# Patient Record
Sex: Male | Born: 1960 | Race: White | Hispanic: No | Smoking: Former smoker
Health system: Southern US, Community
[De-identification: ages and names within clinical notes are randomized; demographics above are authoritative.]

## PROBLEM LIST (undated history)

## (undated) DIAGNOSIS — J449 Chronic obstructive pulmonary disease, unspecified: Secondary | ICD-10-CM

## (undated) DIAGNOSIS — F319 Bipolar disorder, unspecified: Secondary | ICD-10-CM

## (undated) DIAGNOSIS — F329 Major depressive disorder, single episode, unspecified: Secondary | ICD-10-CM

## (undated) DIAGNOSIS — F1910 Other psychoactive substance abuse, uncomplicated: Secondary | ICD-10-CM

## (undated) DIAGNOSIS — E119 Type 2 diabetes mellitus without complications: Secondary | ICD-10-CM

## (undated) DIAGNOSIS — F32A Depression, unspecified: Secondary | ICD-10-CM

## (undated) HISTORY — PX: OTHER SURGICAL HISTORY: SHX169

## (undated) HISTORY — DX: Type 2 diabetes mellitus without complications: E11.9

## (undated) HISTORY — DX: Bipolar disorder, unspecified: F31.9

## (undated) HISTORY — DX: Depression, unspecified: F32.A

## (undated) HISTORY — DX: Chronic obstructive pulmonary disease, unspecified: J44.9

## (undated) HISTORY — DX: Other psychoactive substance abuse, uncomplicated: F19.10

---
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
## 2004-12-12 ENCOUNTER — Encounter: Admission: RE | Admit: 2004-12-12 | Discharge: 2005-03-12 | Payer: Self-pay | Admitting: Psychology

## 2005-04-15 ENCOUNTER — Encounter: Admission: RE | Admit: 2005-04-15 | Discharge: 2005-07-14 | Payer: Self-pay | Admitting: Psychology

## 2008-03-23 DIAGNOSIS — G893 Neoplasm related pain (acute) (chronic): Secondary | ICD-10-CM

## 2008-03-23 HISTORY — DX: Neoplasm related pain (acute) (chronic): G89.3

## 2008-10-21 DIAGNOSIS — C801 Malignant (primary) neoplasm, unspecified: Secondary | ICD-10-CM

## 2008-10-21 HISTORY — DX: Malignant (primary) neoplasm, unspecified: C80.1

## 2008-10-24 ENCOUNTER — Ambulatory Visit: Payer: Self-pay | Admitting: Thoracic Surgery

## 2008-10-24 ENCOUNTER — Inpatient Hospital Stay (HOSPITAL_COMMUNITY): Admission: AD | Admit: 2008-10-24 | Discharge: 2008-11-02 | Payer: Self-pay | Admitting: Thoracic Surgery

## 2008-10-25 ENCOUNTER — Encounter (INDEPENDENT_AMBULATORY_CARE_PROVIDER_SITE_OTHER): Payer: Self-pay | Admitting: Interventional Radiology

## 2008-10-25 ENCOUNTER — Ambulatory Visit: Payer: Self-pay | Admitting: Internal Medicine

## 2008-10-25 ENCOUNTER — Ambulatory Visit: Payer: Self-pay | Admitting: Thoracic Surgery

## 2010-03-23 DIAGNOSIS — C649 Malignant neoplasm of unspecified kidney, except renal pelvis: Secondary | ICD-10-CM

## 2010-03-23 HISTORY — PX: HERNIA REPAIR: SHX51

## 2010-03-23 HISTORY — DX: Malignant neoplasm of unspecified kidney, except renal pelvis: C64.9

## 2010-03-23 HISTORY — PX: OTHER SURGICAL HISTORY: SHX169

## 2010-06-28 LAB — DIFFERENTIAL
Basophils Absolute: 0.2 10*3/uL — ABNORMAL HIGH (ref 0.0–0.1)
Basophils Relative: 0 % (ref 0–1)
Eosinophils Absolute: 0 10*3/uL (ref 0.0–0.7)
Eosinophils Absolute: 0 10*3/uL (ref 0.0–0.7)
Eosinophils Absolute: 0 10*3/uL (ref 0.0–0.7)
Eosinophils Relative: 0 % (ref 0–5)
Lymphocytes Relative: 6 % — ABNORMAL LOW (ref 12–46)
Lymphocytes Relative: 7 % — ABNORMAL LOW (ref 12–46)
Lymphs Abs: 0.7 10*3/uL (ref 0.7–4.0)
Lymphs Abs: 1.1 10*3/uL (ref 0.7–4.0)
Lymphs Abs: 1.2 10*3/uL (ref 0.7–4.0)
Lymphs Abs: 1.3 10*3/uL (ref 0.7–4.0)
Lymphs Abs: 1.4 10*3/uL (ref 0.7–4.0)
Monocytes Absolute: 0.6 10*3/uL (ref 0.1–1.0)
Monocytes Absolute: 1.2 10*3/uL — ABNORMAL HIGH (ref 0.1–1.0)
Monocytes Relative: 4 % (ref 3–12)
Monocytes Relative: 7 % (ref 3–12)
Neutro Abs: 13.4 10*3/uL — ABNORMAL HIGH (ref 1.7–7.7)
Neutro Abs: 14.6 10*3/uL — ABNORMAL HIGH (ref 1.7–7.7)
Neutrophils Relative %: 89 % — ABNORMAL HIGH (ref 43–77)
Neutrophils Relative %: 89 % — ABNORMAL HIGH (ref 43–77)

## 2010-06-28 LAB — CBC
HCT: 35.3 % — ABNORMAL LOW (ref 39.0–52.0)
HCT: 41.6 % (ref 39.0–52.0)
HCT: 42.3 % (ref 39.0–52.0)
HCT: 44.7 % (ref 39.0–52.0)
Hemoglobin: 13.5 g/dL (ref 13.0–17.0)
Hemoglobin: 13.9 g/dL (ref 13.0–17.0)
Hemoglobin: 14 g/dL (ref 13.0–17.0)
Hemoglobin: 14.6 g/dL (ref 13.0–17.0)
Hemoglobin: 14.9 g/dL (ref 13.0–17.0)
MCHC: 32.8 g/dL (ref 30.0–36.0)
MCHC: 32.8 g/dL (ref 30.0–36.0)
MCHC: 33.1 g/dL (ref 30.0–36.0)
MCHC: 33.4 g/dL (ref 30.0–36.0)
MCHC: 33.7 g/dL (ref 30.0–36.0)
MCV: 83.8 fL (ref 78.0–100.0)
MCV: 83.9 fL (ref 78.0–100.0)
MCV: 84 fL (ref 78.0–100.0)
MCV: 84.5 fL (ref 78.0–100.0)
Platelets: 453 10*3/uL — ABNORMAL HIGH (ref 150–400)
Platelets: 478 10*3/uL — ABNORMAL HIGH (ref 150–400)
RBC: 4.88 MIL/uL (ref 4.22–5.81)
RBC: 5.02 MIL/uL (ref 4.22–5.81)
RBC: 5.27 MIL/uL (ref 4.22–5.81)
RBC: 5.32 MIL/uL (ref 4.22–5.81)
RDW: 14 % (ref 11.5–15.5)
RDW: 14.3 % (ref 11.5–15.5)
WBC: 15.2 10*3/uL — ABNORMAL HIGH (ref 4.0–10.5)
WBC: 18 10*3/uL — ABNORMAL HIGH (ref 4.0–10.5)

## 2010-06-28 LAB — BASIC METABOLIC PANEL
BUN: 21 mg/dL (ref 6–23)
CO2: 29 mEq/L (ref 19–32)
CO2: 29 mEq/L (ref 19–32)
Calcium: 9.1 mg/dL (ref 8.4–10.5)
Calcium: 9.3 mg/dL (ref 8.4–10.5)
Chloride: 101 mEq/L (ref 96–112)
Chloride: 97 mEq/L (ref 96–112)
GFR calc Af Amer: >60 mL/min (ref >60)
GFR calc Af Amer: >60 mL/min (ref >60)
GFR calc non Af Amer: >60 mL/min (ref >60)
Glucose, Bld: 124 mg/dL — ABNORMAL HIGH (ref 70–99)
Potassium: 4.4 mEq/L (ref 3.5–5.1)
Sodium: 133 mEq/L — ABNORMAL LOW (ref 135–145)
Sodium: 134 mEq/L — ABNORMAL LOW (ref 135–145)

## 2010-06-28 LAB — COMPREHENSIVE METABOLIC PANEL
ALT: 13 U/L (ref 0–53)
ALT: 20 U/L (ref 0–53)
AST: 13 U/L (ref 0–37)
Alkaline Phosphatase: 68 U/L (ref 39–117)
BUN: 14 mg/dL (ref 6–23)
BUN: 16 mg/dL (ref 6–23)
CO2: 26 mEq/L (ref 19–32)
CO2: 28 mEq/L (ref 19–32)
CO2: 28 mEq/L (ref 19–32)
Calcium: 9 mg/dL (ref 8.4–10.5)
Calcium: 9 mg/dL (ref 8.4–10.5)
Calcium: 9.5 mg/dL (ref 8.4–10.5)
Calcium: 9.9 mg/dL (ref 8.4–10.5)
Creatinine, Ser: 0.68 mg/dL (ref 0.4–1.5)
Creatinine, Ser: 0.87 mg/dL (ref 0.4–1.5)
Creatinine, Ser: 1.05 mg/dL (ref 0.4–1.5)
GFR calc Af Amer: >60 mL/min (ref >60)
GFR calc non Af Amer: >60 mL/min (ref >60)
GFR calc non Af Amer: >60 mL/min (ref >60)
GFR calc non Af Amer: >60 mL/min (ref >60)
Glucose, Bld: 129 mg/dL — ABNORMAL HIGH (ref 70–99)
Glucose, Bld: 131 mg/dL — ABNORMAL HIGH (ref 70–99)
Glucose, Bld: 147 mg/dL — ABNORMAL HIGH (ref 70–99)
Potassium: 4.1 mEq/L (ref 3.5–5.1)
Sodium: 135 mEq/L (ref 135–145)
Sodium: 136 mEq/L (ref 135–145)
Sodium: 137 mEq/L (ref 135–145)
Total Protein: 6.6 g/dL (ref 6.0–8.3)
Total Protein: 7 g/dL (ref 6.0–8.3)

## 2010-06-28 LAB — URINALYSIS, ROUTINE W REFLEX MICROSCOPIC
Bilirubin Urine: NEGATIVE
Glucose, UA: NEGATIVE mg/dL
Hgb urine dipstick: NEGATIVE
Ketones, ur: NEGATIVE mg/dL
pH: 7 (ref 5.0–8.0)

## 2010-08-05 NOTE — Consult Note (Signed)
Jeremy Logan, Jeremy Logan NO.:  [id number]   MEDICAL RECORD NO.:  [id number]          PATIENT TYPE:  INP   LOCATION:  1343                         FACILITY:  [facility]   PHYSICIAN:  Lajuana Matte, MD  DATE OF BIRTH:  16-Aug-1960   DATE OF CONSULTATION:  10/25/2008  DATE OF DISCHARGE:                                 CONSULTATION   REFERRING PHYSICIAN:  Ines Bloomer, M.D.   [name] is a pleasant 50 year old white male who reports approximately  a 9 month history of right shoulder pain.  He states his father had a  stroke about the same time his symptoms began and he deferred his care  to take care of his father.  The pain became worse over the past 3  months.  He saw a chiropractor for several months and then presented to  the emergency room for evaluation because of increasing pain.  Mr. Elsass  states he was taking 30-45 mg of oxycodone every 8 hours with an  additional 15 mg of oxycodone every 4 hours with reasonable relief/  control of his pain.  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   REVIEW OF SYSTEMS:  Positive for pain in the right shoulder area, mild  shortness of breath and constipation.  He denied any chest pain,  headache, blurred vision or double vision.  No cough, palpitations or  syncopal episodes.  Denied abdominal pain, diarrhea, melena, or  hematochezia. .  No dysuria, hematuria or increased frequency.   PAST MEDICAL HISTORY:  Significant for anxiety disorder and  hypertension.  He had a tonsillectomy at the age of 89.   FAMILY HISTORY:  His mother is deceased from squamous cell carcinoma of  the throat/ lung.  Grandmother deceased from leukemia.  Father had a  recent cerebrovascular accident.  There is no family history of coronary  artery disease or diabetes  mellitus.   SOCIAL HISTORY:  Previously worked in the [employer].  He  injured his back and has not worked since 2005.  He  smoked two to three  packs of cigarettes daily and has done so for approximately 38 years.  He denied any history of alcohol or drug use or abuse.   ALLERGIES:  He has no known drug allergies.   MEDICATIONS:  Verapamil,  Xanax, Klonopin, oxycodone and Aleve.   PHYSICAL EXAMINATION:  Reveals a temperature of 99.0, pulse 86,  respirations 22, blood pressure 147/98, O2 saturation  95% on 2 liters.  GENERAL:  Exam reveals a pleasant 50 year old white male who is awake,  slightly anxious but in no acute distress.  HEENT:  Normocephalic, atraumatic.  Sclerae anicteric.  Oropharynx is  clear.  NECK:  Supple without lymphadenopathy.  CHEST:  Chest revealed generalized crackles, worse at the bases.  ABDOMEN:  Soft, nontender and nondistended.  There is an umbilical  hernia and no other masses.  LOWER EXTREMITIES:  Without edema.   Workup to date includes a CT guided right upper lobe mass fine-needle  aspirate and core biopsies performed October 25, 2008.  Pathology is  pending.  PET scan performed October 25, 2008 revealed a large right  superior sulcus tumor demonstrating chest wall and vertebral  body  invasion and probable metastasis to the right paratracheal lymph nodes.  No distant metastasis identified.  An MRI of the chest and MRI of the  brain as well as pulmonary function studies are pending.   IMPRESSION AND PLAN:  1. Pancoast tumor. specific pathology pending.  No distant metastasis      per the CAT scan but positive for right paratracheal lymph nodes.      We are awaiting the results of the MRI of brain and chest to      complete staging.  Dr. Arbutus Ped will discuss diseases specifics of      treatment with the patient and his family and will most likely      consist of a course of concurrent chemoradiation.  Pain control      over the past 24 hours:   The patient has received 40 mg of      OxyContin and 40 mg of oxycodone from the breakthrough Percocet and      350 mg of Fentanyl as reviewed with pharmacy.  We recommend      increasing his OxyContin 40 mg p.o. q.12 h. and at least for now      leaves his current pain medications as they are and adjust as      needed.  For DVT prophylaxis, we will initiate Lovenox at 40 mg      subcu daily.  2.  Constipation.  We will add MiraLax 17 grams mixed      in 8 ounces of water daily.  2. Nausea. We will add Compazine 10 mg p.o. q.6 h.  As needed.   Thank you for consulting [place] in the care of this gentleman.  We will  continue to follow with you.[name], PA.      Lajuana Matte, MD  Electronically Signed    AJ/MEDQ  D:  10/26/2008  T:  10/26/2008  Job:  [id number]

## 2010-08-05 NOTE — Letter (Signed)
October 24, 2008   [name]  [address]  [place], [place] [address]   Re:  Jeremy Logan, Jeremy Logan                DOB:  1960/10/31   Dear Dr. Olena Leatherwood:   I saw the patient in the office today.  This 50 year old patient was  having severe chest pain and right shoulder pain for 2-3 months.  He has  had a 10-pound weight loss, he is seeing a Land.  He had so much  pain, he went to the emergency room and his CT scan showed a large  apical mass or Pancoast tumor or superior sulcus tumor with destruction  of the second rib and the T2 vertebral body and possibly impingement at  the T1 and T2 neuroforamen.  He has had severe pain radiating down his  right arm.  He has been on Percocet with a little pain relief, now  problem is long track signs.  He smokes 2-3 packs per day.   PAST MEDICAL HISTORY:  Significant for no allergies but he is on  verapamil 180 mg twice a day for essential hypertension, Xanax 1 mg  three times a day, oxycodone 15 mg three times a day, Klonopin 2 mg  twice a day and Aleve.  He has been treated for anxiety disorder in the  past.   FAMILY HISTORY:  Noncontributory.   SOCIAL HISTORY:  He is single, has one child.  He is unemployed.  He  smokes 2 pack cigarettes a day.  He does not drink alcohol on a regular  basis.   REVIEW OF SYSTEMS:  GENERAL:  He is 210 pounds.  He is 71 inches.  He  has had loss of appetite and weight, and he has had some fever.  CARDIAC:  No angina or atrial fibrillation.  PULMONARY:  He has had no hemoptysis, but cough and wheezing.  GI:  He has got an abdominal incisional hernia.  GU:  No kidney disease, dysuria or frequent urination.  VASCULAR:  No claudication, DVT, or TIAs.  NEUROLOGIC:  No dizziness headaches, blackouts, or seizures.  MUSCULOSKELETAL:  No arthritis or joint pain or rash.  See history of  present illness.  PSYCHIATRIC:  See past medical history.  ENT:  No change in his eyesight or hearing.  HEMATOLOGIC:  No problems  with bleeding, clotting disorders or anemia.   PHYSICAL EXAMINATION:  General:  He is a pale appearing Caucasian male  with holding his right arm.  Vital Signs:  His blood pressure is 155/96,  pulse 77, respirations 18, sats were 98%.  Head, Eyes, Ears, Nose and  Throat:  Unremarkable.  Neck:  Supple without thyromegaly.  There is no  supraclavicular or axillary adenopathy.  Chest:  Clear to auscultation  and percussion.  There is no palpable masses.  Heart:  Regular, sinus  rhythm.  No murmur.  Abdomen:  Soft.  There is no hepatosplenomegaly.  Extremities:  Pulses are 2+.  There is no clubbing or edema.  Neurologic:  He is oriented x3.  Sensory and motor intact.  Cranial  nerves are intact.  He does have decreased sensation in the right arm  and good movement of the right arm to severe pain.   I feel that this guy has an advanced Pancoast tumor, and because of his  severe pain and destruction of his vertebral body, we need to admit him  to the hospital for workup with a PET  scan and MRI of the brain as well  as to look for spinal cord compression and as well as biopsy of the  lesion.  We will admit him to the hospital today to [facility].  I will have Dr. Kathrynn Running and Dr. Arbutus Ped see him to start  immediate treatment.   Sincerely,   Jeremy Logan, M.D.  Electronically Signed   DPB/MEDQ  D:  10/24/2008  T:  10/25/2008  Job:  [id number]   cc:   Eual Fines A. Chodri, M.D.

## 2010-08-05 NOTE — Discharge Summary (Signed)
Jeremy Logan, Jeremy Logan                ACCOUNT NO.:  [id number]   MEDICAL RECORD NO.:  [id number]          PATIENT TYPE:  INP   LOCATION:  1343                         FACILITY:  [facility]   PHYSICIAN:  Lajuana Matte, MD  DATE OF BIRTH:  1960-05-30   DATE OF ADMISSION:  10/24/2008  DATE OF DISCHARGE:  11/02/2008                               DISCHARGE SUMMARY   DISCHARGE DIAGNOSES:  1. Right Pancoast tumor.  2. Pain management.  3. Anxiety.  4. Hypertension.  5. Constipation.   HISTORY:  Jeremy Logan is 50 year old two pack a day smoker.  He  has  history of anxiety disorder and a 3 month plus history of pain in his  right shoulder and right arm.  He saw his chiropractor for several  months and finally because of increasing pain went to the emergency room  where CT scan was obtained and revealed a large Pancoast tumor.   HOSPITAL COURSE:  The patient was admitted to the hospital and underwent  CT guided biopsy under care of Dr. Edwyna Shell on October 25, 2008.  Pathology  revealed poorly differentiated adenocarcinoma.  On October 26, 2008, the  patient underwent an MRI of the brain for staging purposes and it was  negative for any metastatic disease.  He also underwent an MRI of the  chest with and without contrast and this revealed a large apical  Pancoast tumor involving the chest wall.  There was invasion and  destruction of the right second rib in the right T1 and T2 vertebral  bodies.  There was involvement of neural foramen at T1-2 and T2-3 with  no direct spinal canal invasion or cord compression.  There were  enlarged supraclavicular nodes.  No involvement of the brachial plexus.  PET scan on October 25, 2008, for staging purposes again revealed the  large right superior sulcus tumor demonstrating chest wall and vertebral  body invasion with markedly hypermetabolic with an SUV maps of 34.3.  There was probable metastasis to the right paratracheal lymph nodes.  There was no distant  metastasis identified.  There was an umbilical  hernia present.  The patient was seen in consultation by medical  oncology and radiation oncology and started on a course of concurrent  chemoradiation.  He received his first cycle of chemotherapy on October 29, 2008, and by day of discharge had received 6 of 10 planned fractions  of radiation therapy.  Primary issues during his admission were that of  pain control and we were ultimately able to receive reasonable pain  control by increasing his OxyContin to 80 mg by mouth every 8 hours.  However, he was still required frequent breakthrough pain medications at  a rate of 30 mg every 3 hours.  For GI prophylaxis he was treated with  Protonix at 40 mg p.o. daily.  For DVT prophylaxis with Lovenox at 40 mg  subcutaneously daily.  Constipation was aggressively treated with  Colace, MiraLax and Senokot-S.  He was continued on his Xanax and  Klonopin for his anxiety and was continued on his verapamil for his  hypertension.  DISCHARGE CONDITION:  Fair.   DISCHARGE DIET:  No restrictions.   DISCHARGE ACTIVITY:  Increase activity slowly.   DISCHARGE MEDICATIONS:  1. OxyContin 80 mg by mouth every 8 hours for pain.  2. Oxycodone 15 mg one to two by mouth every 4-6 hours as needed for      breakthrough pain.  3. Klonopin 2 mg by mouth twice a day.  4. Verapamil 180 mg by mouth twice a day.  5. MiraLax 17 grams mixed in 8 ounces of water daily.  6. Xanax 1 mg by mouth every 8 hours as needed for anxiety.  7. Ibuprofen 400 mg by mouth every 8 hours with food.  8. Compazine 10 mg by mouth every 6 hours as needed for nausea.  9. Decadron 4 mg p.o. daily.  10.Nicoderm CQ patch 21 mg applied daily.  11.Diflucan 100 mg p.o. daily for 5 days.   DISCHARGE FOLLOWUP:  The patient will be followed in [place] by Dr.  Gilman Buttner for his chemotherapy and will continue his radiation therapy  under the care of Dr. Mitzi Hansen.  He will see Dr. Gilman Buttner on November 05, 2008, at 2:30 and will follow up as scheduled with Dr. Mitzi Hansen for his  radiation therapy.      [name], PA.      Lajuana Matte, MD  Electronically Signed    AJ/MEDQ  D:  11/02/2008  T:  11/02/2008  Job:  [id number]   cc:   Dellia Beckwith, M.D.  Fax: [phone]

## 2010-08-05 NOTE — H&P (Signed)
NAMEMADEX, SEALS                ACCOUNT NO.:  [id number]   MEDICAL RECORD NO.:  [id number]          PATIENT TYPE:  INP   LOCATION:  1343                         FACILITY:  [facility]   PHYSICIAN:  Ines Bloomer, M.D. DATE OF BIRTH:  03-29-1960   DATE OF ADMISSION:  10/24/2008  DATE OF DISCHARGE:                              HISTORY & PHYSICAL   CHIEF COMPLAINT:  Severe shoulder and right arm pain.   HISTORY OF PRESENT ILLNESS:  This 50 year old patient is a 2-pack-a-day  smoker.  He has a history of anxiety disorder, has a 39-months-plus  history of pain particularly in his right shoulder and right arm.  He  saw a chiropractor for several months and then finally because of the  pain, went to the emergency room where a CT scan was obtained that  showed a large Pancoast tumor with obstruction of the T2 vertebral body  and possible impingement of the T1 and T2 intervertebral foramen and  destruction of the second rib.  He was referred to Dr. Williams Che from  his medical doctor who referred him to [place] for evaluation.  He has been  spiking temperatures at night.  He has been on oxycodone for the pain.  He has mainly right chest pain, right shoulder pain, and pain down his  right arm particularly with the ulnar nerve distribution as well as  having some fever and some chills.  No hemoptysis.   PAST MEDICAL HISTORY:  He has no allergies.   He is on:  1. Verapamil 180 mg ER twice a day.  2. Xanax 1 mg 3 times a day.  3. Klonopin 2 mg twice a day.  4. Aleve.  5. Oxycodone 15 mg 3-4 times a day.   FAMILY HISTORY:  Positive for cancer and hypertension.   PAST SURGICAL HISTORY:  He started smoking at age 23, smokes 2 packs a  day.  He does not drink.  He is unemployed.  He has got 1 child.   REVIEW OF SYSTEMS:  CARDIAC:  No angina or atrial fibrillation.  GENERAL:  He has had weight loss, 10-15 pounds, weakness, and decreased  appetite.  PULMONARY:  See history of present illness.  GI:   He has  abdominal hernia.  GU:  No kidney disease, dysuria, or frequent  urination.  VASCULAR:  No claudication, DVT, or TIAs.  NEUROLOGIC:  No  dizziness, headaches, blackouts, or seizures.  MUSCULOSKELETAL:  Severe  shoulder and arm pain.  EYE/ENT:  No changes in eyesight or hearing.  HEMATOLOGICAL:  No problems with bleeding, clotting disorders, or  anemia.  PSYCHIATRIC:  See past medical history.   PHYSICAL EXAMINATION:  VITAL SIGNS:  His blood pressure is 155/96, pulse  77, respirations 18, sats are 93%, and temp is 98.9.  HEAD:  Atraumatic.  EYES:  Pupils equal, reactive to light and accommodation.  Extraocular  movements normal.  EARS:  Tympanic membranes are intact.  NOSE:  There is no septal deviation.  THROAT:  Without lesion.  NECK:  Supple without thyromegaly.  There is no supraclavicular or  axillary adenopathy.  CHEST:  Decreased breath sounds in the right apex.  HEART:  Regular sinus rhythm, no murmurs.  ABDOMEN:  Soft.  There is a midline abdominal hernia.  EXTREMITIES:  Pulses are 2+.  There is no clubbing or edema.  NEUROLOGIC:  He is oriented x3.  Sensory and motor intact.  He has  sensation and strength in his right arm.   IMPRESSION:  1. 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  2. Severe pain secondary to above.  3. Tobacco abuse.  4. Chronic obstructive pulmonary disease.  5. Essential hypertension.  6. Anxiety disorder.   PLAN:  Admit to the hospital for an MRI of the brain and chest, PET  scan, needle biopsy of the mass, control of pain, and initiate treatment  with radiation and chemotherapy.      Ines Bloomer, M.D.  Electronically Signed     DPB/MEDQ  D:  10/24/2008  T:  10/25/2008  Job:  [id number]

## 2011-03-19 IMAGING — CT CT BIOPSY
1 of 2 series · 14 of 18 positions shown, 18 images · non-contrast
Comparison: none

CLINICAL DATA: Right upper lobe posterior apical mass invading the
chest wall and adjacent ribs

[Series 2: rtn chest without st · axial · non-contrast · 0.59mm/px · z∈[-184,-120]mm · 14 of 16 slices shown, 18 images]
[im 2/16  mediastinal]
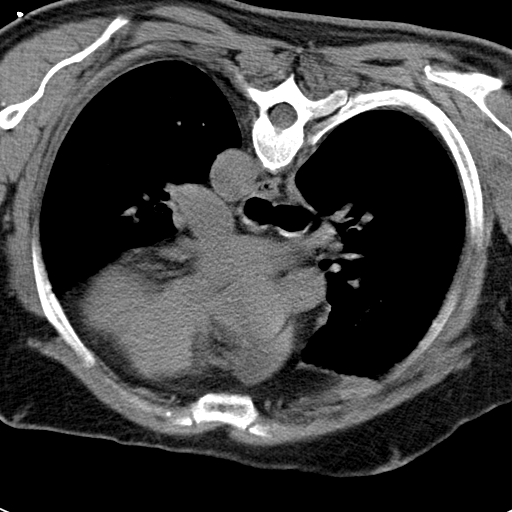
[im 2/16  lung]
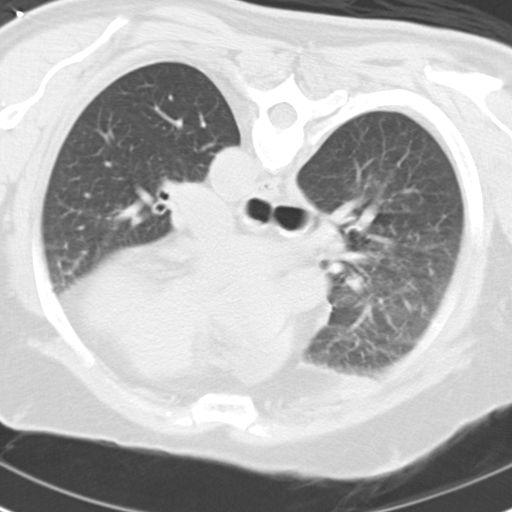
[im 3/16  lung]
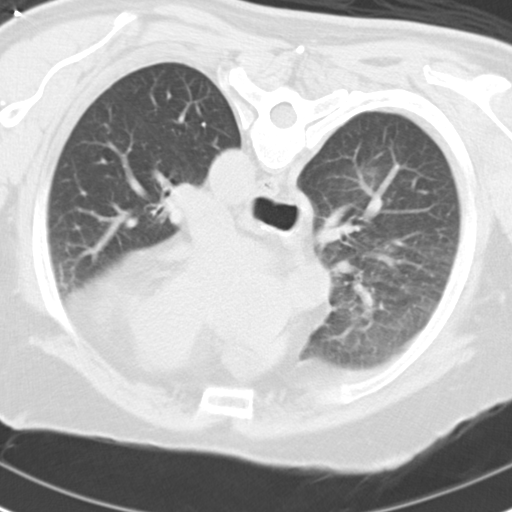
[im 4/16  lung]
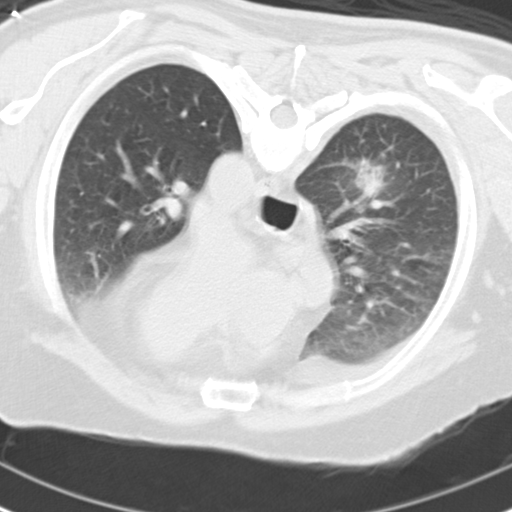
[im 5/16  lung]
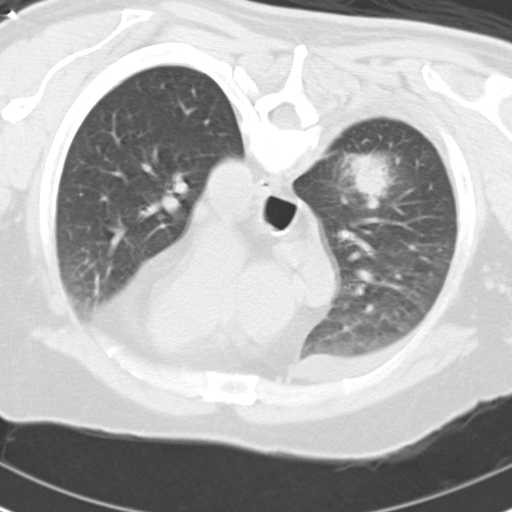
[im 6/16  mediastinal]
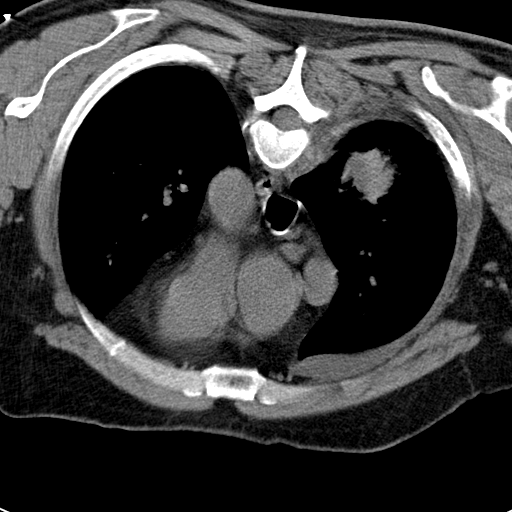
[im 6/16  lung]
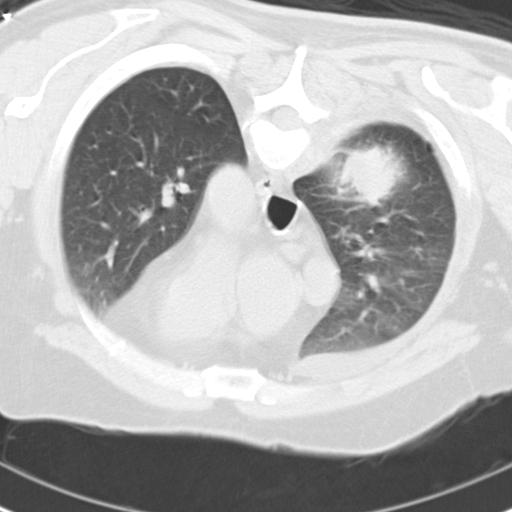
[im 7/16  lung]
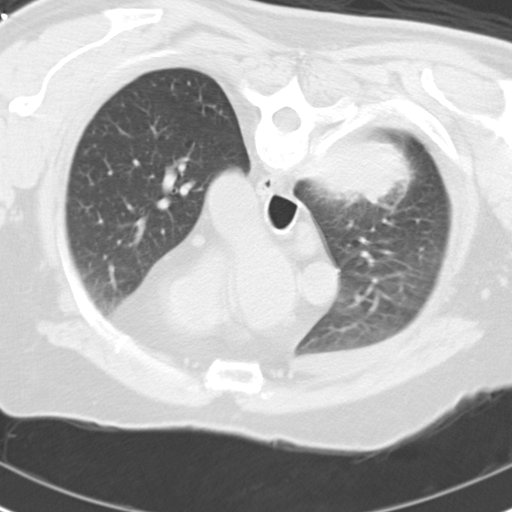
[im 8/16  lung]
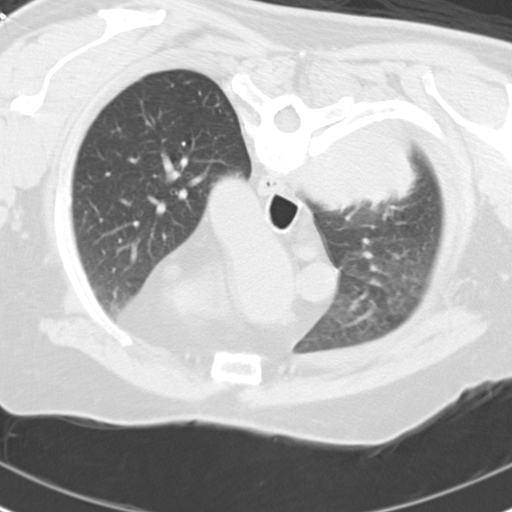
[im 9/16  lung]
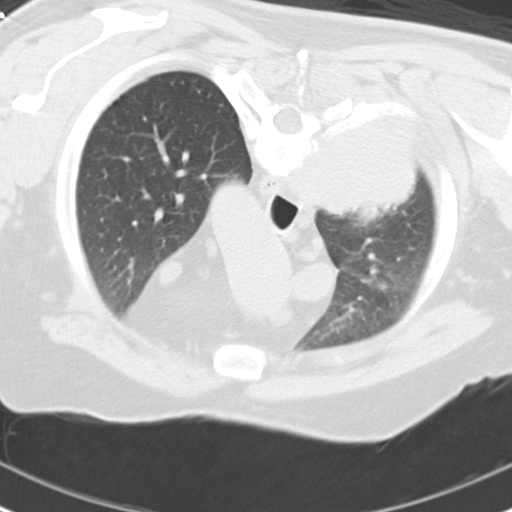
[im 10/16  mediastinal]
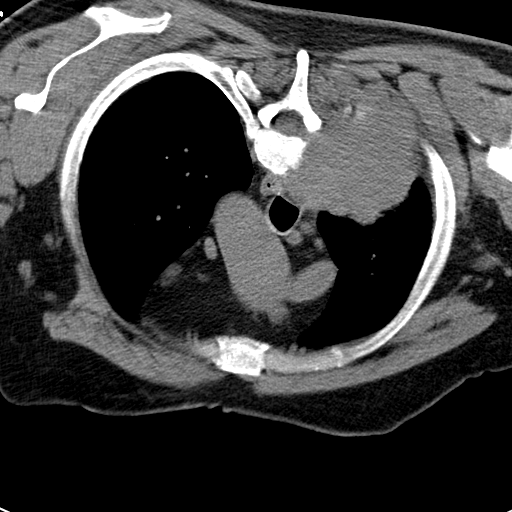
[im 10/16  lung]
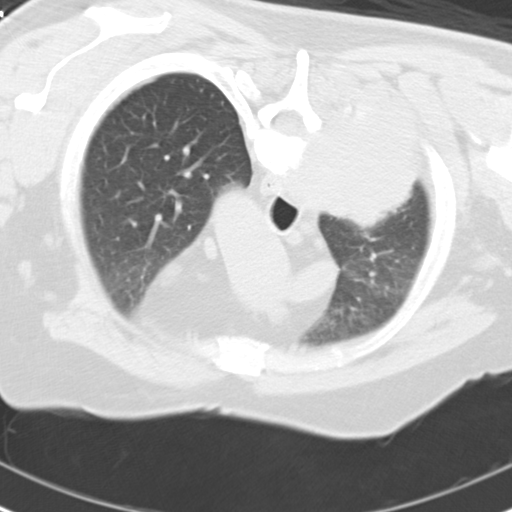
[im 11/16  lung]
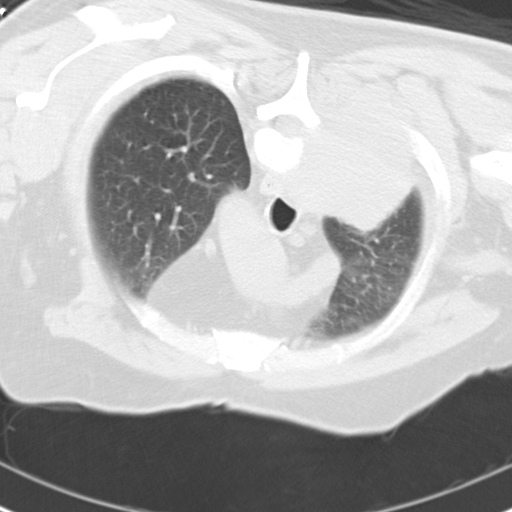
[im 12/16  lung]
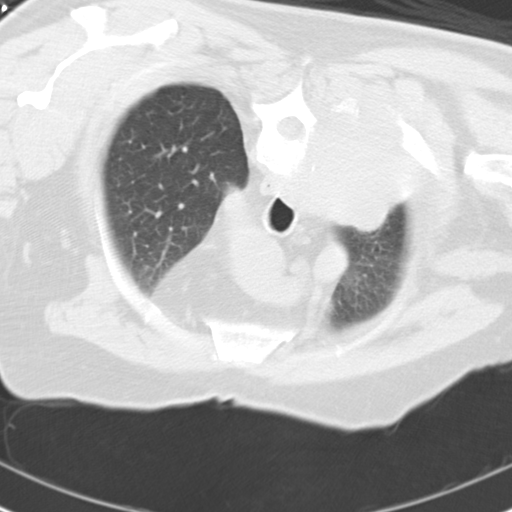
[im 13/16  lung]
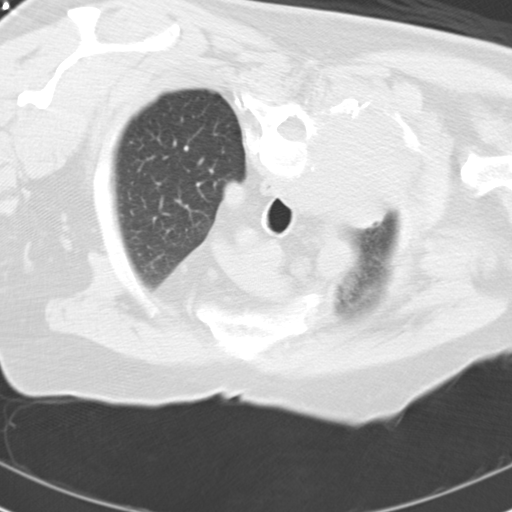
[im 14/16  mediastinal]
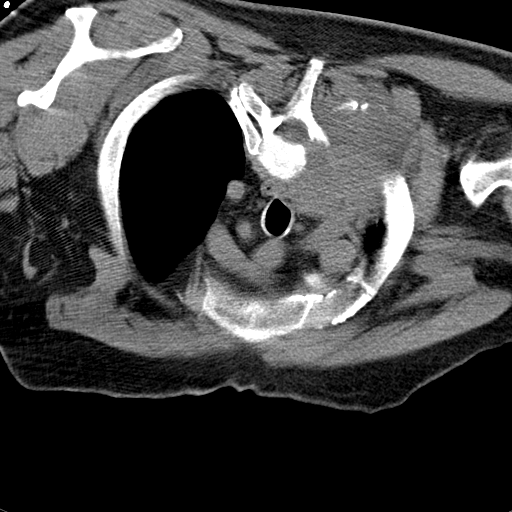
[im 14/16  lung]
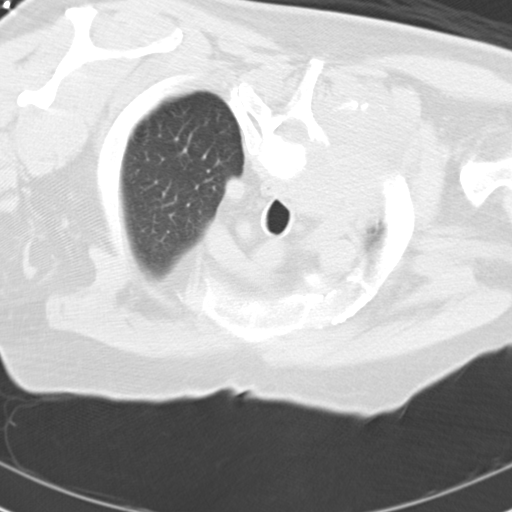
[im 15/16  lung]
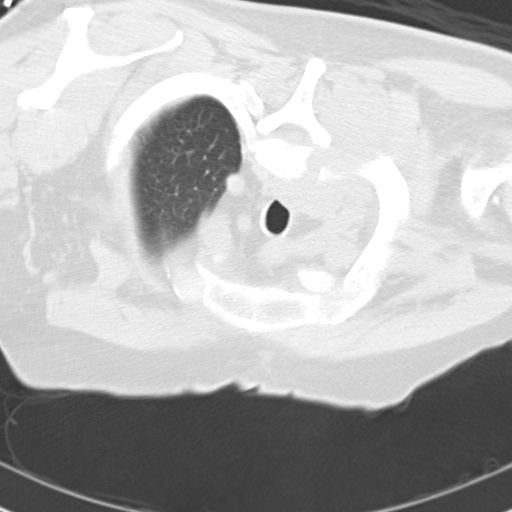

[14 of 18 positions shown; findings below may reference images not displayed]

CT RIGHT UPPER LOBE MASS FNA AND CORE BIOPSIES

Date:  10/25/2008 [DATE]

Radiologist:  Rueben Leah, M.D.

Medications:  6 mg Versed, 100 mcg Fentanyl

Guidance:  CT

Fluoroscopy time:  None.

Sedation time:  35 minutes

Contrast volume:  None.

Complications:  No immediate

PROCEDURE/FINDINGS:

Informed consent was obtained from the patient following
explanation of the procedure, risks, benefits and alternatives.
The patient understands, agrees and consents for the procedure.
All questions were addressed.  A time out was performed.

Maximal barrier sterile technique utilized including caps, mask,
sterile gowns, sterile gloves, large sterile drape, hand hygiene,
and betadine.

Previous CT scan from [facility] was reviewed.  The patient
was positioned prone.  Noncontrast localization CT was performed
through the upper chest.  The invasive posterior right apical mass
was identified.  Under sterile conditions and local anesthesia, a
17 gauge 6.8 cm access needle was advanced from a posterior
paraspinous approach into the invasive right upper lobe mass.
Needle position was confirmed with CT.  20 gauge 15 cm Ru
Jumper were utilized for FNA biopsy.  For additional staining, an
18 gauge core biopsy was also obtained and placed in formalin.
Needles were removed.  Initial pathology review was positive for
malignant cells.  Final pathology pending.  No immediate
complication.  The patient tolerated the procedure well.
IMPRESSION: CT posterior right upper lobe mass FNA and core biopsies.

## 2015-02-15 DIAGNOSIS — F1914 Other psychoactive substance abuse with psychoactive substance-induced mood disorder: Secondary | ICD-10-CM

## 2015-02-15 DIAGNOSIS — Z85118 Personal history of other malignant neoplasm of bronchus and lung: Secondary | ICD-10-CM | POA: Diagnosis not present

## 2015-02-15 DIAGNOSIS — B3781 Candidal esophagitis: Secondary | ICD-10-CM

## 2015-02-15 DIAGNOSIS — G894 Chronic pain syndrome: Secondary | ICD-10-CM | POA: Diagnosis not present

## 2015-02-15 DIAGNOSIS — Z8553 Personal history of malignant neoplasm of renal pelvis: Secondary | ICD-10-CM | POA: Diagnosis not present

## 2015-02-15 DIAGNOSIS — J449 Chronic obstructive pulmonary disease, unspecified: Secondary | ICD-10-CM | POA: Diagnosis not present

## 2015-02-22 ENCOUNTER — Ambulatory Visit (HOSPITAL_COMMUNITY)
Admission: AD | Admit: 2015-02-22 | Discharge: 2015-02-22 | Disposition: A | Discharge status: Home / Self Care | Payer: Medicare Other | Source: Other Acute Inpatient Hospital | Attending: Internal Medicine | Admitting: Internal Medicine

## 2015-02-22 DIAGNOSIS — J9600 Acute respiratory failure, unspecified whether with hypoxia or hypercapnia: Secondary | ICD-10-CM | POA: Insufficient documentation

## 2015-03-24 DIAGNOSIS — I2699 Other pulmonary embolism without acute cor pulmonale: Secondary | ICD-10-CM

## 2015-03-24 HISTORY — DX: Other pulmonary embolism without acute cor pulmonale: I26.99

## 2015-05-08 DIAGNOSIS — F3341 Major depressive disorder, recurrent, in partial remission: Secondary | ICD-10-CM | POA: Insufficient documentation

## 2015-05-08 DIAGNOSIS — F3132 Bipolar disorder, current episode depressed, moderate: Secondary | ICD-10-CM | POA: Insufficient documentation

## 2015-05-09 DIAGNOSIS — G894 Chronic pain syndrome: Secondary | ICD-10-CM | POA: Insufficient documentation

## 2015-06-12 DIAGNOSIS — E782 Mixed hyperlipidemia: Secondary | ICD-10-CM | POA: Insufficient documentation

## 2015-06-12 DIAGNOSIS — I10 Essential (primary) hypertension: Secondary | ICD-10-CM | POA: Insufficient documentation

## 2015-06-12 DIAGNOSIS — C649 Malignant neoplasm of unspecified kidney, except renal pelvis: Secondary | ICD-10-CM | POA: Insufficient documentation

## 2015-06-12 DIAGNOSIS — C3411 Malignant neoplasm of upper lobe, right bronchus or lung: Secondary | ICD-10-CM | POA: Insufficient documentation

## 2015-06-12 DIAGNOSIS — M810 Age-related osteoporosis without current pathological fracture: Secondary | ICD-10-CM | POA: Insufficient documentation

## 2015-06-21 DIAGNOSIS — E1169 Type 2 diabetes mellitus with other specified complication: Secondary | ICD-10-CM | POA: Insufficient documentation

## 2015-06-21 DIAGNOSIS — Z794 Long term (current) use of insulin: Secondary | ICD-10-CM | POA: Insufficient documentation

## 2015-07-17 DIAGNOSIS — I5032 Chronic diastolic (congestive) heart failure: Secondary | ICD-10-CM | POA: Insufficient documentation

## 2015-07-26 DIAGNOSIS — Z85118 Personal history of other malignant neoplasm of bronchus and lung: Secondary | ICD-10-CM | POA: Diagnosis not present

## 2015-07-29 DIAGNOSIS — J449 Chronic obstructive pulmonary disease, unspecified: Secondary | ICD-10-CM | POA: Insufficient documentation

## 2015-09-15 DIAGNOSIS — F419 Anxiety disorder, unspecified: Secondary | ICD-10-CM

## 2015-09-15 DIAGNOSIS — G8929 Other chronic pain: Secondary | ICD-10-CM

## 2015-09-15 DIAGNOSIS — D72829 Elevated white blood cell count, unspecified: Secondary | ICD-10-CM | POA: Diagnosis not present

## 2015-09-15 DIAGNOSIS — R042 Hemoptysis: Secondary | ICD-10-CM | POA: Diagnosis not present

## 2015-09-15 DIAGNOSIS — J441 Chronic obstructive pulmonary disease with (acute) exacerbation: Secondary | ICD-10-CM | POA: Diagnosis not present

## 2015-09-15 DIAGNOSIS — Z85118 Personal history of other malignant neoplasm of bronchus and lung: Secondary | ICD-10-CM

## 2015-09-15 DIAGNOSIS — J189 Pneumonia, unspecified organism: Secondary | ICD-10-CM

## 2015-09-15 DIAGNOSIS — K219 Gastro-esophageal reflux disease without esophagitis: Secondary | ICD-10-CM

## 2015-09-15 DIAGNOSIS — E669 Obesity, unspecified: Secondary | ICD-10-CM

## 2015-09-15 DIAGNOSIS — I10 Essential (primary) hypertension: Secondary | ICD-10-CM

## 2015-09-15 DIAGNOSIS — J9601 Acute respiratory failure with hypoxia: Secondary | ICD-10-CM

## 2015-09-16 DIAGNOSIS — D72829 Elevated white blood cell count, unspecified: Secondary | ICD-10-CM | POA: Diagnosis not present

## 2015-09-16 DIAGNOSIS — R042 Hemoptysis: Secondary | ICD-10-CM | POA: Diagnosis not present

## 2015-09-16 DIAGNOSIS — J441 Chronic obstructive pulmonary disease with (acute) exacerbation: Secondary | ICD-10-CM | POA: Diagnosis not present

## 2015-09-16 DIAGNOSIS — J9601 Acute respiratory failure with hypoxia: Secondary | ICD-10-CM | POA: Diagnosis not present

## 2015-10-22 DIAGNOSIS — I82409 Acute embolism and thrombosis of unspecified deep veins of unspecified lower extremity: Secondary | ICD-10-CM

## 2015-10-22 HISTORY — DX: Acute embolism and thrombosis of unspecified deep veins of unspecified lower extremity: I82.409

## 2015-11-06 DIAGNOSIS — I824Y2 Acute embolism and thrombosis of unspecified deep veins of left proximal lower extremity: Secondary | ICD-10-CM | POA: Insufficient documentation

## 2015-11-09 DIAGNOSIS — C3411 Malignant neoplasm of upper lobe, right bronchus or lung: Secondary | ICD-10-CM | POA: Diagnosis not present

## 2015-11-09 DIAGNOSIS — I2699 Other pulmonary embolism without acute cor pulmonale: Secondary | ICD-10-CM | POA: Diagnosis not present

## 2015-11-09 DIAGNOSIS — I82402 Acute embolism and thrombosis of unspecified deep veins of left lower extremity: Secondary | ICD-10-CM

## 2015-11-13 DIAGNOSIS — I2699 Other pulmonary embolism without acute cor pulmonale: Secondary | ICD-10-CM | POA: Insufficient documentation

## 2015-12-03 DIAGNOSIS — Z7901 Long term (current) use of anticoagulants: Secondary | ICD-10-CM | POA: Diagnosis not present

## 2015-12-03 DIAGNOSIS — Z86718 Personal history of other venous thrombosis and embolism: Secondary | ICD-10-CM

## 2015-12-03 DIAGNOSIS — Z86711 Personal history of pulmonary embolism: Secondary | ICD-10-CM

## 2015-12-03 DIAGNOSIS — Z85118 Personal history of other malignant neoplasm of bronchus and lung: Secondary | ICD-10-CM

## 2015-12-06 ENCOUNTER — Other Ambulatory Visit: Payer: Self-pay | Admitting: Endocrinology

## 2015-12-07 ENCOUNTER — Other Ambulatory Visit: Payer: Self-pay | Admitting: Endocrinology

## 2015-12-09 ENCOUNTER — Other Ambulatory Visit: Payer: Self-pay | Admitting: Endocrinology

## 2015-12-09 ENCOUNTER — Other Ambulatory Visit: Payer: Self-pay

## 2016-01-24 DIAGNOSIS — Z85528 Personal history of other malignant neoplasm of kidney: Secondary | ICD-10-CM | POA: Diagnosis not present

## 2016-01-24 DIAGNOSIS — Z85118 Personal history of other malignant neoplasm of bronchus and lung: Secondary | ICD-10-CM | POA: Diagnosis not present

## 2016-01-24 DIAGNOSIS — R42 Dizziness and giddiness: Secondary | ICD-10-CM | POA: Diagnosis not present

## 2016-01-24 DIAGNOSIS — I2699 Other pulmonary embolism without acute cor pulmonale: Secondary | ICD-10-CM | POA: Diagnosis not present

## 2016-04-15 DIAGNOSIS — K5903 Drug induced constipation: Secondary | ICD-10-CM | POA: Insufficient documentation

## 2016-04-15 DIAGNOSIS — G43909 Migraine, unspecified, not intractable, without status migrainosus: Secondary | ICD-10-CM | POA: Insufficient documentation

## 2016-04-15 DIAGNOSIS — E669 Obesity, unspecified: Secondary | ICD-10-CM | POA: Insufficient documentation

## 2016-05-07 DIAGNOSIS — R0600 Dyspnea, unspecified: Secondary | ICD-10-CM | POA: Diagnosis not present

## 2016-05-07 DIAGNOSIS — Z86711 Personal history of pulmonary embolism: Secondary | ICD-10-CM | POA: Diagnosis not present

## 2016-05-07 DIAGNOSIS — Z85118 Personal history of other malignant neoplasm of bronchus and lung: Secondary | ICD-10-CM | POA: Diagnosis not present

## 2017-02-04 DIAGNOSIS — G8929 Other chronic pain: Secondary | ICD-10-CM | POA: Diagnosis not present

## 2017-02-04 DIAGNOSIS — Z85118 Personal history of other malignant neoplasm of bronchus and lung: Secondary | ICD-10-CM | POA: Diagnosis not present

## 2017-02-04 DIAGNOSIS — J441 Chronic obstructive pulmonary disease with (acute) exacerbation: Secondary | ICD-10-CM

## 2017-02-04 DIAGNOSIS — F329 Major depressive disorder, single episode, unspecified: Secondary | ICD-10-CM

## 2017-02-04 DIAGNOSIS — E1169 Type 2 diabetes mellitus with other specified complication: Secondary | ICD-10-CM | POA: Diagnosis not present

## 2017-02-04 DIAGNOSIS — J9601 Acute respiratory failure with hypoxia: Secondary | ICD-10-CM | POA: Diagnosis not present

## 2017-02-04 DIAGNOSIS — F419 Anxiety disorder, unspecified: Secondary | ICD-10-CM | POA: Diagnosis not present

## 2017-02-05 DIAGNOSIS — G8929 Other chronic pain: Secondary | ICD-10-CM | POA: Diagnosis not present

## 2017-02-05 DIAGNOSIS — J9601 Acute respiratory failure with hypoxia: Secondary | ICD-10-CM | POA: Diagnosis not present

## 2017-02-05 DIAGNOSIS — R0602 Shortness of breath: Secondary | ICD-10-CM

## 2017-02-05 DIAGNOSIS — J441 Chronic obstructive pulmonary disease with (acute) exacerbation: Secondary | ICD-10-CM | POA: Diagnosis not present

## 2017-02-05 DIAGNOSIS — E1169 Type 2 diabetes mellitus with other specified complication: Secondary | ICD-10-CM | POA: Diagnosis not present

## 2017-02-05 DIAGNOSIS — Z85118 Personal history of other malignant neoplasm of bronchus and lung: Secondary | ICD-10-CM | POA: Diagnosis not present

## 2017-02-05 DIAGNOSIS — F329 Major depressive disorder, single episode, unspecified: Secondary | ICD-10-CM | POA: Diagnosis not present

## 2017-02-05 DIAGNOSIS — F419 Anxiety disorder, unspecified: Secondary | ICD-10-CM | POA: Diagnosis not present

## 2017-02-06 DIAGNOSIS — Z86718 Personal history of other venous thrombosis and embolism: Secondary | ICD-10-CM | POA: Diagnosis not present

## 2017-02-06 DIAGNOSIS — J9601 Acute respiratory failure with hypoxia: Secondary | ICD-10-CM | POA: Diagnosis not present

## 2017-02-06 DIAGNOSIS — Z85118 Personal history of other malignant neoplasm of bronchus and lung: Secondary | ICD-10-CM

## 2017-02-06 DIAGNOSIS — Z86711 Personal history of pulmonary embolism: Secondary | ICD-10-CM | POA: Diagnosis not present

## 2017-02-06 DIAGNOSIS — G8929 Other chronic pain: Secondary | ICD-10-CM | POA: Diagnosis not present

## 2017-02-06 DIAGNOSIS — F329 Major depressive disorder, single episode, unspecified: Secondary | ICD-10-CM

## 2017-02-06 DIAGNOSIS — Z923 Personal history of irradiation: Secondary | ICD-10-CM | POA: Diagnosis not present

## 2017-02-06 DIAGNOSIS — Z9221 Personal history of antineoplastic chemotherapy: Secondary | ICD-10-CM

## 2017-02-06 DIAGNOSIS — R911 Solitary pulmonary nodule: Secondary | ICD-10-CM

## 2017-02-06 DIAGNOSIS — J441 Chronic obstructive pulmonary disease with (acute) exacerbation: Secondary | ICD-10-CM | POA: Diagnosis not present

## 2017-02-06 DIAGNOSIS — E1169 Type 2 diabetes mellitus with other specified complication: Secondary | ICD-10-CM | POA: Diagnosis not present

## 2017-02-06 DIAGNOSIS — F419 Anxiety disorder, unspecified: Secondary | ICD-10-CM | POA: Diagnosis not present

## 2017-02-07 DIAGNOSIS — J9601 Acute respiratory failure with hypoxia: Secondary | ICD-10-CM | POA: Diagnosis not present

## 2017-02-07 DIAGNOSIS — J441 Chronic obstructive pulmonary disease with (acute) exacerbation: Secondary | ICD-10-CM | POA: Diagnosis not present

## 2017-02-07 DIAGNOSIS — G8929 Other chronic pain: Secondary | ICD-10-CM | POA: Diagnosis not present

## 2017-02-07 DIAGNOSIS — Z85118 Personal history of other malignant neoplasm of bronchus and lung: Secondary | ICD-10-CM

## 2017-02-07 DIAGNOSIS — E1169 Type 2 diabetes mellitus with other specified complication: Secondary | ICD-10-CM | POA: Diagnosis not present

## 2017-02-07 DIAGNOSIS — F329 Major depressive disorder, single episode, unspecified: Secondary | ICD-10-CM | POA: Diagnosis not present

## 2017-02-07 DIAGNOSIS — F419 Anxiety disorder, unspecified: Secondary | ICD-10-CM | POA: Diagnosis not present

## 2017-05-31 DIAGNOSIS — E875 Hyperkalemia: Secondary | ICD-10-CM | POA: Diagnosis not present

## 2017-05-31 DIAGNOSIS — R911 Solitary pulmonary nodule: Secondary | ICD-10-CM | POA: Diagnosis not present

## 2017-05-31 DIAGNOSIS — R970 Elevated carcinoembryonic antigen [CEA]: Secondary | ICD-10-CM | POA: Diagnosis not present

## 2017-05-31 DIAGNOSIS — Z7951 Long term (current) use of inhaled steroids: Secondary | ICD-10-CM | POA: Diagnosis not present

## 2017-05-31 DIAGNOSIS — E1165 Type 2 diabetes mellitus with hyperglycemia: Secondary | ICD-10-CM | POA: Diagnosis not present

## 2017-05-31 DIAGNOSIS — Z85118 Personal history of other malignant neoplasm of bronchus and lung: Secondary | ICD-10-CM | POA: Diagnosis not present

## 2017-07-29 DIAGNOSIS — Z9221 Personal history of antineoplastic chemotherapy: Secondary | ICD-10-CM | POA: Diagnosis not present

## 2017-07-29 DIAGNOSIS — G8929 Other chronic pain: Secondary | ICD-10-CM | POA: Diagnosis not present

## 2017-07-29 DIAGNOSIS — K432 Incisional hernia without obstruction or gangrene: Secondary | ICD-10-CM | POA: Diagnosis not present

## 2017-07-29 DIAGNOSIS — Z85118 Personal history of other malignant neoplasm of bronchus and lung: Secondary | ICD-10-CM | POA: Diagnosis not present

## 2017-07-29 DIAGNOSIS — Z8553 Personal history of malignant neoplasm of renal pelvis: Secondary | ICD-10-CM | POA: Diagnosis not present

## 2017-07-29 DIAGNOSIS — Z923 Personal history of irradiation: Secondary | ICD-10-CM | POA: Diagnosis not present

## 2017-07-29 DIAGNOSIS — R0600 Dyspnea, unspecified: Secondary | ICD-10-CM | POA: Diagnosis not present

## 2017-07-29 DIAGNOSIS — F329 Major depressive disorder, single episode, unspecified: Secondary | ICD-10-CM | POA: Diagnosis not present

## 2017-07-29 DIAGNOSIS — Z79899 Other long term (current) drug therapy: Secondary | ICD-10-CM | POA: Diagnosis not present

## 2017-07-29 DIAGNOSIS — Z905 Acquired absence of kidney: Secondary | ICD-10-CM | POA: Diagnosis not present

## 2017-07-29 DIAGNOSIS — E119 Type 2 diabetes mellitus without complications: Secondary | ICD-10-CM | POA: Diagnosis not present

## 2018-01-03 DIAGNOSIS — Z923 Personal history of irradiation: Secondary | ICD-10-CM | POA: Diagnosis not present

## 2018-01-03 DIAGNOSIS — Z86711 Personal history of pulmonary embolism: Secondary | ICD-10-CM

## 2018-01-03 DIAGNOSIS — Z86718 Personal history of other venous thrombosis and embolism: Secondary | ICD-10-CM

## 2018-01-03 DIAGNOSIS — Z794 Long term (current) use of insulin: Secondary | ICD-10-CM

## 2018-01-03 DIAGNOSIS — Z905 Acquired absence of kidney: Secondary | ICD-10-CM

## 2018-01-03 DIAGNOSIS — Z9981 Dependence on supplemental oxygen: Secondary | ICD-10-CM

## 2018-01-03 DIAGNOSIS — F319 Bipolar disorder, unspecified: Secondary | ICD-10-CM

## 2018-01-03 DIAGNOSIS — Z85118 Personal history of other malignant neoplasm of bronchus and lung: Secondary | ICD-10-CM | POA: Diagnosis not present

## 2018-01-03 DIAGNOSIS — G893 Neoplasm related pain (acute) (chronic): Secondary | ICD-10-CM

## 2018-01-03 DIAGNOSIS — Z9221 Personal history of antineoplastic chemotherapy: Secondary | ICD-10-CM

## 2018-01-03 DIAGNOSIS — R970 Elevated carcinoembryonic antigen [CEA]: Secondary | ICD-10-CM

## 2018-01-03 DIAGNOSIS — F329 Major depressive disorder, single episode, unspecified: Secondary | ICD-10-CM

## 2018-01-03 DIAGNOSIS — J449 Chronic obstructive pulmonary disease, unspecified: Secondary | ICD-10-CM

## 2018-01-03 DIAGNOSIS — C7951 Secondary malignant neoplasm of bone: Secondary | ICD-10-CM

## 2018-01-03 DIAGNOSIS — E119 Type 2 diabetes mellitus without complications: Secondary | ICD-10-CM

## 2018-01-03 DIAGNOSIS — Z8553 Personal history of malignant neoplasm of renal pelvis: Secondary | ICD-10-CM | POA: Diagnosis not present

## 2018-03-25 DIAGNOSIS — K1121 Acute sialoadenitis: Secondary | ICD-10-CM

## 2018-09-16 DIAGNOSIS — Z85118 Personal history of other malignant neoplasm of bronchus and lung: Secondary | ICD-10-CM | POA: Diagnosis not present

## 2019-01-17 DIAGNOSIS — C3411 Malignant neoplasm of upper lobe, right bronchus or lung: Secondary | ICD-10-CM | POA: Diagnosis not present

## 2019-01-31 DIAGNOSIS — C3410 Malignant neoplasm of upper lobe, unspecified bronchus or lung: Secondary | ICD-10-CM

## 2019-01-31 HISTORY — DX: Malignant neoplasm of upper lobe, unspecified bronchus or lung: C34.10

## 2019-02-02 DIAGNOSIS — C3411 Malignant neoplasm of upper lobe, right bronchus or lung: Secondary | ICD-10-CM

## 2019-02-13 LAB — PULMONARY FUNCTION TEST

## 2019-02-20 ENCOUNTER — Other Ambulatory Visit: Payer: Self-pay

## 2019-02-21 ENCOUNTER — Encounter: Payer: Self-pay | Admitting: Thoracic Surgery (Cardiothoracic Vascular Surgery)

## 2019-02-21 ENCOUNTER — Encounter: Payer: Self-pay | Admitting: *Deleted

## 2019-02-21 ENCOUNTER — Institutional Professional Consult (permissible substitution) (INDEPENDENT_AMBULATORY_CARE_PROVIDER_SITE_OTHER): Payer: Medicare Other | Admitting: Thoracic Surgery (Cardiothoracic Vascular Surgery)

## 2019-02-21 ENCOUNTER — Other Ambulatory Visit: Payer: Self-pay

## 2019-02-21 VITALS — BP 121/75 | HR 86 | Temp 97.7°F | Resp 20 | Ht 71.0 in | Wt 275.0 lb

## 2019-02-21 DIAGNOSIS — C801 Malignant (primary) neoplasm, unspecified: Secondary | ICD-10-CM

## 2019-02-21 DIAGNOSIS — I82409 Acute embolism and thrombosis of unspecified deep veins of unspecified lower extremity: Secondary | ICD-10-CM | POA: Insufficient documentation

## 2019-02-21 DIAGNOSIS — I82402 Acute embolism and thrombosis of unspecified deep veins of left lower extremity: Secondary | ICD-10-CM

## 2019-02-21 DIAGNOSIS — G893 Neoplasm related pain (acute) (chronic): Secondary | ICD-10-CM

## 2019-02-21 DIAGNOSIS — F329 Major depressive disorder, single episode, unspecified: Secondary | ICD-10-CM | POA: Insufficient documentation

## 2019-02-21 DIAGNOSIS — R918 Other nonspecific abnormal finding of lung field: Secondary | ICD-10-CM | POA: Diagnosis not present

## 2019-02-21 DIAGNOSIS — F319 Bipolar disorder, unspecified: Secondary | ICD-10-CM

## 2019-02-21 DIAGNOSIS — C3411 Malignant neoplasm of upper lobe, right bronchus or lung: Secondary | ICD-10-CM

## 2019-02-21 DIAGNOSIS — F32A Depression, unspecified: Secondary | ICD-10-CM | POA: Insufficient documentation

## 2019-02-21 DIAGNOSIS — F1910 Other psychoactive substance abuse, uncomplicated: Secondary | ICD-10-CM | POA: Insufficient documentation

## 2019-02-21 DIAGNOSIS — E119 Type 2 diabetes mellitus without complications: Secondary | ICD-10-CM | POA: Insufficient documentation

## 2019-02-21 NOTE — Progress Notes (Signed)
PCP is Dough, Jaymes Graff, MD Referring Provider is Gardiner Rhyme, MD  Chief Complaint  Patient presents with  . Lung Mass    Surgical eval,  CT BX 01/31/19, PET Scan 01/16/19, Chest CT 01/06/19, PFT's 02/03/19     HPI: [name] is sent for consultation regarding a right upper lobe lung cancer.  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  He was diagnosed with a stage IIIa Pancoast tumor in 2010.  He was treated with chemoradiation.  There was invasion and destruction of the right second rib and the right T1 and T2 vertebral bodies.  There was involvement of the right paratracheal nodes but no distant metastases.  He was treated with concurrent chemoradiation with an excellent result.  He was hospitalized in 2017 with acute respiratory failure.  He ultimately required tracheostomy.  In August 2017 he had a deep venous thrombosis of the left leg with severe swelling.  He was found to have pulmonary emboli with evidence of right heart strain.  He was found to have a 7 mm right upper lobe nodule in November 2018.  In March 2019 the nodule had increased to 1.5 cm.  A PET/CT showed no evidence of hypermetabolic activity.  The nodule waxed and waned to some degree but a CT in October showed it was increased in size.  On PET CT it was hypermetabolic with an SUV of 6.1.  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  He quit smoking in 2017.  He does vape.  He denies any change in appetite or weight loss.  He does get short of breath with activity.  He is on 2 L of oxygen at home. Zubrod Score: At the time of surgery this patient's most appropriate activity status/level should be described as: []      0    Normal activity, no symptoms [x]     1    Restricted in physical strenuous activity but ambulatory, able to do out light work []     2    Ambulatory and capable of self care, unable to do work activities, up and about >50 % of waking hours                              []     3    Only limited self care, in bed greater than 50% of waking hours []     4    Completely disabled, no self care, confined to bed or chair []     5    Moribund  Past Medical History:  Diagnosis Date  . Bipolar disease, chronic ([place])   . Cancer ([place]) 10/2008   lung...PANCOAST TUMOR...CHEMOERADIATION  . Cancer of lung, upper lobe ([place]) 01/31/2019   RIGHT per NEEDLE BIOPSY  . 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 COPD (chronic obstructive pulmonary disease) ([place])   . Depression   . Diabetes mellitus without complication ([place])    INSULIN DEPENDENT DM  . DVT (deep venous thrombosis) ([place]) 10/2015   HAD 6 MONTH TX WITH ANTICOAGULANTS  . Pulmonary embolism ([place]) 2017   TREATED WITH XARELTO  .  Renal cell carcinoma ([place]) 03/2010   PARTIAL LEFT NEPHRECTOMY/[facility]  . Substance abuse ([place])    TOBACCO...QUIT SMOKING 2018    Past Surgical History:  Procedure Laterality Date  . HERNIA REPAIR  3818   UMBILICAL INCARCERATED  . PARTIAL LEFT NEPHECTOMY  03/2010    History reviewed. No pertinent family history.  Social History Social History   Tobacco Use  . Smoking status: Former Smoker    Types: Cigarettes    Quit date: 02/21/2015    Years since quitting: 4.0  Substance Use Topics  . Alcohol use: Not on file  . Drug use: Not on file    Current Outpatient Medications  Medication Sig Dispense Refill  . aspirin EC 81 MG tablet Take 81 mg by mouth daily.    Marland Kitchen atorvastatin (LIPITOR) 80 MG tablet Take 80 mg by mouth daily at 6 PM.     . carvedilol (COREG) 3.125 MG tablet Take 3.125 mg by mouth 2 (two) times daily  with a meal.     . Dulaglutide (TRULICITY) 1.5 EX/9.3ZJ SOPN Inject 1.5 mg into the skin every 7 (seven) days.     Marland Kitchen FLUoxetine (PROZAC) 20 MG capsule Take 20 mg by mouth daily.     . furosemide (LASIX) 20 MG tablet Take 20 mg by mouth.    . gabapentin (NEURONTIN) 300 MG capsule Take 300 mg by mouth 2 (two) times daily.     . insulin aspart (NOVOLOG) 100 UNIT/ML injection Inject 20 Units into the skin 3 (three) times daily before meals.    . Insulin Detemir (LEVEMIR FLEXTOUCH) 100 UNIT/ML Pen Inject 80 Units into the skin daily.     Marland Kitchen OLANZapine (ZYPREXA) 20 MG tablet Take 10 mg by mouth at bedtime.     Marland Kitchen spironolactone (ALDACTONE) 25 MG tablet Take 25 mg by mouth daily.    Marland Kitchen albuterol (PROAIR HFA) 108 (90 Base) MCG/ACT inhaler 2 puffs every 6 (six) hours as needed.     No current facility-administered medications for this visit.     No Known Allergies  Review of Systems  Constitutional: Negative for activity change, appetite change and unexpected weight change.  HENT: Negative for trouble swallowing and voice change.   Respiratory: Positive for apnea and shortness of breath. Negative for cough and wheezing.        Home oxygen 2 L  Cardiovascular: Negative for chest pain and leg swelling.  Gastrointestinal: Negative for abdominal distention and abdominal pain.  Genitourinary: Negative for difficulty urinating and dysuria.  Musculoskeletal: Positive for back pain and neck pain.  Neurological: Negative for seizures and weakness.  Hematological: Negative for adenopathy. Does not bruise/bleed easily.  Psychiatric/Behavioral: Positive for dysphoric mood.  All other systems reviewed and are negative.   BP 121/75   Pulse 86   Temp 97.7 F (36.5 C) (Skin)   Resp 20   Ht 5\' 11"  (1.803 m)   Wt 275 lb (124.7 kg)   SpO2 93% Comment: RA  BMI 38.35 kg/m  Physical Exam Vitals signs reviewed.  Constitutional:      General: He is not in acute distress.    Appearance: He is obese.  HENT:      Head: Normocephalic and atraumatic.  Eyes:     General: No scleral icterus.    Extraocular Movements: Extraocular movements intact.  Neck:     Musculoskeletal: Neck supple.  Cardiovascular:     Rate and Rhythm: Normal rate and regular rhythm.     Heart sounds:  No murmur. No friction rub. No gallop.   Pulmonary:     Effort: Pulmonary effort is normal. No respiratory distress.     Breath sounds: No wheezing or rales.     Comments: Diminished breath sounds bilaterally Abdominal:     General: There is no distension.     Palpations: Abdomen is soft.     Tenderness: There is no abdominal tenderness.  Musculoskeletal:        General: No swelling.  Lymphadenopathy:     Cervical: No cervical adenopathy.  Skin:    General: Skin is warm and dry.  Neurological:     General: No focal deficit present.     Mental Status: He is alert and oriented to person, place, and time.     Motor: No weakness.  Psychiatric:     Comments: Flat affect    Diagnostic Tests: I reviewed his CT and PET/CT images in the [facility] PACS There is a spiculated right upper lobe nodule maximum diameter 1.8 cm that is hypermetabolic on PET/CT.  No evidence of regional or distant metastases.  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  He was first noted to have a right upper lobe lung nodule back in 2018.  That nodule has waxed and waned to some degree over time but has gradually gotten larger.  On PET CT the nodules hypermetabolic with no evidence of regional or distant metastases.  Needle biopsy showed non-small cell carcinoma.  Findings are  consistent with a clinical T1, N0, stage Ia non-small cell carcinoma.  I discussed potential treatment options for stage Ia lesion with Mr. Word and his sister.  We discussed the relative advantages and disadvantages of both surgery and radiation.  The nodule is far enough away from his previous radiation field, that I would think he would be a good candidate for stereotactic radiation, but we will need consultation with radiation oncology to determine that for sure.  As far surgery, the nodule is technically resectable.  His pulmonary function would be adequate to tolerate resection.  However, the situation is complicated by his previous radiation as well as his significant comorbidities.  He does have a large pulmonary artery which raises concern for pulmonary hypertension particularly in the setting of previous pulmonary emboli.  I do not think a minimally invasive approach would be feasible given his prior radiation.  All things considered I think he would be an extremely high risk surgical patient.  I think stereotactic radiation would probably be a better option for him, especially considering his amazing response to chemoradiation with his previous tumor.  Plan: We will refer to radiation oncology to discuss possible stereotactic radiation.  I will be happy to meet with Mr. Holzhauer again after he is seen radiation oncology if he wants to further discuss surgery.  Melrose Nakayama, MD [facility] [phone]

## 2019-02-22 LAB — PULMONARY FUNCTION TEST

## 2019-02-23 ENCOUNTER — Ambulatory Visit
Admission: RE | Admit: 2019-02-23 | Discharge: 2019-02-23 | Disposition: A | Discharge status: Home / Self Care | Payer: Medicare Other | Source: Ambulatory Visit | Attending: Radiation Oncology | Admitting: Radiation Oncology

## 2019-02-23 ENCOUNTER — Encounter: Payer: Self-pay | Admitting: Radiation Oncology

## 2019-02-23 ENCOUNTER — Other Ambulatory Visit: Payer: Self-pay

## 2019-02-23 DIAGNOSIS — C3411 Malignant neoplasm of upper lobe, right bronchus or lung: Secondary | ICD-10-CM

## 2019-02-23 DIAGNOSIS — C642 Malignant neoplasm of left kidney, except renal pelvis: Secondary | ICD-10-CM

## 2019-02-23 NOTE — Patient Instructions (Signed)
Coronavirus (COVID-19) Are you at risk?  Are you at risk for the Coronavirus (COVID-19)?  To be considered HIGH RISK for Coronavirus (COVID-19), you have to meet the following criteria:  . Traveled to [place], [place], [place], [place] or [place]; or in the [place] to [place], [place], [place], or [place]; and have fever, cough, and shortness of breath within the last 2 weeks of travel OR . Been in close contact with a person diagnosed with COVID-19 within the last 2 weeks and have fever, cough, and shortness of breath . IF YOU DO NOT MEET THESE CRITERIA, YOU ARE CONSIDERED LOW RISK FOR COVID-19.  What to do if you are HIGH RISK for COVID-19?  . If you are having a medical emergency, call 911. . Seek medical care right away. Before you go to a doctor's office, urgent care or emergency department, call ahead and tell them about your recent travel, contact with someone diagnosed with COVID-19, and your symptoms. You should receive instructions from your physician's office regarding next steps of care.  . When you arrive at healthcare provider, tell the healthcare staff immediately you have returned from visiting [place], [place], [place], [place] or [place]; or traveled in the [place] to [place], [place], [place], or [place]; in the last two weeks or you have been in close contact with a person diagnosed with COVID-19 in the last 2 weeks.   . Tell the health care staff about your symptoms: fever, cough and shortness of breath. . After you have been seen by a medical provider, you will be either: o Tested for (COVID-19) and discharged home on quarantine except to seek medical care if symptoms worsen, and asked to  - Stay home and avoid contact with others until you get your results (4-5 days)  - Avoid travel on public transportation if possible (such as bus, train, or airplane) or o Sent to the Emergency Department by EMS for evaluation, COVID-19 testing, and possible  admission depending on your condition and test results.  What to do if you are LOW RISK for COVID-19?  Reduce your risk of any infection by using the same precautions used for avoiding the common cold or flu:  . Wash your hands often with soap and warm water for at least 20 seconds.  If soap and water are not readily available, use an alcohol-based hand sanitizer with at least 60% alcohol.  . If coughing or sneezing, cover your mouth and nose by coughing or sneezing into the elbow areas of your shirt or coat, into a tissue or into your sleeve (not your hands). . Avoid shaking hands with others and consider head nods or verbal greetings only. . Avoid touching your eyes, nose, or mouth with unwashed hands.  . Avoid close contact with people who are sick. . Avoid places or events with large numbers of people in one location, like concerts or sporting events. . Carefully consider travel plans you have or are making. . If you are planning any travel outside or inside the [place], visit the CDC's Travelers' Health webpage for the latest health notices. . If you have some symptoms but not all symptoms, continue to monitor at home and seek medical attention if your symptoms worsen. . If you are having a medical emergency, call 911.   ADDITIONAL HEALTHCARE OPTIONS FOR PATIENTS  [facility] / e-Visit: [email or web address]         [facility]: [phone]  [facility]: [phone]                   [facility]: [phone]   

## 2019-02-23 NOTE — Progress Notes (Signed)
Thoracic Location of Tumor / Histology: I reviewed his CT and PET/CT images in the [facility] PACS There is a spiculated right upper lobe nodule maximum diameter 1.8 cm that is hypermetabolic on PET/CT.  No evidence of regional or distant metastases.  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  He was first noted to have a right upper lobe lung nodule back in 2018.  That nodule has waxed and waned to some degree over time but has gradually gotten larger.  On PET CT the nodules hypermetabolic with no evidence of regional or distant metastases.  Needle biopsy showed non-small cell carcinoma.  Findings are consistent with a clinical T1, N0, stage Ia non-small cell carcinoma.  Biopsies of 1 (if applicable) revealed: right upper lobe  Tobacco/Marijuana/Snuff/ETOH use: He quit smoking in 2017.  He does vape.  He denies any change in appetite or weight loss.  He does get short of breath with activity.  He is on 2 L of oxygen at home.  Past/Anticipated interventions by cardiothoracic surgery, if anyAs far surgery, the nodule is technically resectable.  His pulmonary function would be adequate to tolerate resection.  However, the situation is complicated by his previous radiation as well as his significant comorbidities.  He does have a large pulmonary artery which raises concern for pulmonary hypertension particularly in the setting of previous pulmonary emboli.  I do not think a minimally invasive approach would be feasible given his prior radiation.  All things considered I think he would be an extremely high risk surgical patient.  I think stereotactic radiation would probably be a better option for him,  especially considering his amazing response to chemoradiation with his previous tumor.:  Past/Anticipated interventions by medical oncology, if any: no  Signs/Symptoms  Weight changes, if any: denies  Respiratory complaints, if any: SOB with exertion 2 L [place]  Hemoptysis, if any: no  Pain issues, if any:  no  SAFETY ISSUES:  Prior radiation? Yes 2010 Pancoast tumor Jeremy Logan  Pacemaker/ICD? no   Possible current pregnancy?no  Is the patient on methotrexate? no  Current Complaints / other details:  Spoke with patient's sister he is beside of her but does not talk on the phone. Questions answered and concerns addressed

## 2019-02-23 NOTE — Progress Notes (Signed)
Radiation Oncology         [phone] ________________________________  Initial Outpatient Consultation - Conducted via telephone due to current COVID-19 concerns for limiting patient exposure  I spoke with the patient to conduct this consult visit via telephone to spare the patient unnecessary potential exposure in the healthcare setting during the current COVID-19 pandemic. The patient was notified in advance and was offered a [facility] meeting to allow for face to face communication but unfortunately reported that they did not have the appropriate resources/technology to support such a visit and instead preferred to proceed with a telephone consult.   ________________________________  Name: Jeremy Logan.        MRN: [id number]  Date of Service: 02/23/2019 DOB: 03-30-1960  WJ:XBJYN, Jeremy Graff, MD  Jeremy Logan, *     REFERRING PHYSICIAN: Melrose Logan, *   DIAGNOSIS: The encounter diagnosis was Malignant neoplasm of upper lobe of right lung ([place]).   HISTORY OF PRESENT ILLNESS: Jeremy Logan. is a 58 y.o. male seen at the request of Dr. Roxan Logan for a newly diagnosed non-small cell lung cancer in the right upper lobe.  He has a history in 2010 of a stage III non-small cell lung cancer when he presented with evidence of Pancoast tumor.  He received chemoradiation with Dr. Hinton Rao and Dr. Orlene Erm in [place].  He has been followed in surveillance since.  In 2012 he was also diagnosed with a stage I renal cell carcinoma treated with nephrectomy at [facility] and has also been without evidence of disease.  Because of his history his surveillance scans of the chest have been followed quite closely.  There is been waxing and waning of a nodule that developed in the right upper lobe for several years, in June of this 2020 it had progressed slightly in size.  It was recommended that he have interval scan yearly, and on 01/06/2019 a CT  scan revealed this lesion to measure 18 x 9 mm, it had previously been back to 9 x 7 mm.  A PET scan on 10/22/202020 showed the lesion to have an SUV of 6.1.  No other evidence of hypermetabolism was identified.  Postprocedural changes were noted along the left lower kidney.  He proceeded with a evaluation with Dr. Roxan Logan and CT-guided biopsy on 01/31/2019.  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  He met with Dr. Roxan Logan however interested in hearing alternative therapy to surgery as his surgery would require an open procedure because of his prior treatment.  He is contacted today by phone.  PREVIOUS RADIATION THERAPY:   2010: We are in the process of gathering his prior radiotherapy records, but it sounds as though he received approximately 66 Gy over 6 weeks to the right lung and regional nodes.  He was treated with Dr. Orlene Erm in [place].   PAST MEDICAL HISTORY:  Past Medical History:  Diagnosis Date   Bipolar disease, chronic ([place])    Cancer ([place]) 10/2008   lung...PANCOAST TUMOR...CHEMOERADIATION   Cancer of lung, upper lobe (Jeremy Logan) 01/31/2019   RIGHT per NEEDLE BIOPSY   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   RIGHT SHOULDER AND NECK AFTER DESTRUCTION OF THE T1/T2 VERTEBRAL BODIES   COPD (chronic obstructive pulmonary disease) ([place])    Depression    Diabetes mellitus without complication ([place])    INSULIN DEPENDENT DM   DVT (deep venous thrombosis) ([place]) 10/2015  HAD 6 MONTH TX WITH ANTICOAGULANTS   Pulmonary embolism ([place]) 2017   TREATED WITH XARELTO   Renal cell carcinoma (Jeremy Logan) 03/2010   PARTIAL LEFT NEPHRECTOMY/[facility]   Substance abuse ([place])    TOBACCO...QUIT SMOKING 2018       PAST SURGICAL HISTORY: Past Surgical History:  Procedure Laterality Date   HERNIA REPAIR  3086   UMBILICAL INCARCERATED   PARTIAL LEFT NEPHECTOMY  03/2010     FAMILY HISTORY: No family  history on file.   SOCIAL HISTORY:  reports that he quit smoking about 4 years ago. His smoking use included cigarettes. He does not have any smokeless tobacco history on file.   ALLERGIES: Patient has no known allergies.   MEDICATIONS:  Current Outpatient Medications  Medication Sig Dispense Refill   albuterol (PROAIR HFA) 108 (90 Base) MCG/ACT inhaler 2 puffs every 6 (six) hours as needed.     aspirin EC 81 MG tablet Take 81 mg by mouth daily.     atorvastatin (LIPITOR) 80 MG tablet Take 80 mg by mouth daily at 6 PM.      carvedilol (COREG) 3.125 MG tablet Take 3.125 mg by mouth 2 (two) times daily with a meal.      Dulaglutide (TRULICITY) 1.5 VH/8.4ON SOPN Inject 1.5 mg into the skin every 7 (seven) days.      FLUoxetine (PROZAC) 20 MG capsule Take 20 mg by mouth daily.      furosemide (LASIX) 20 MG tablet Take 20 mg by mouth.     gabapentin (NEURONTIN) 300 MG capsule Take 300 mg by mouth 2 (two) times daily.      insulin aspart (NOVOLOG) 100 UNIT/ML injection Inject 20 Units into the skin 3 (three) times daily before meals.     Insulin Detemir (LEVEMIR FLEXTOUCH) 100 UNIT/ML Pen Inject 80 Units into the skin daily.      OLANZapine (ZYPREXA) 20 MG tablet Take 10 mg by mouth at bedtime.      spironolactone (ALDACTONE) 25 MG tablet Take 25 mg by mouth daily.     No current facility-administered medications for this encounter.      REVIEW OF SYSTEMS: On review of systems, the patient reports that he is doing well overall. He uses O2 as needed but does not exert himself often. He denies any chest pain, shortness of breath, cough, fevers, chills, night sweats, unintended weight changes. He denies any bowel or bladder disturbances, and denies abdominal pain, nausea or vomiting. He denies any new musculoskeletal or joint aches or pains. A complete review of systems is obtained and is otherwise negative.     PHYSICAL EXAM:  Unable to assess due to encounter type   ECOG =  0  0 - Asymptomatic (Fully active, able to carry on all predisease activities without restriction)  1 - Symptomatic but completely ambulatory (Restricted in physically strenuous activity but ambulatory and able to carry out work of a light or sedentary nature. For example, light housework, office work)  2 - Symptomatic, <50% in bed during the day (Ambulatory and capable of all self care but unable to carry out any work activities. Up and about more than 50% of waking hours)  3 - Symptomatic, >50% in bed, but not bedbound (Capable of only limited self-care, confined to bed or chair 50% or more of waking hours)  4 - Bedbound (Completely disabled. Cannot carry on any self-care. Totally confined to bed or chair)  5 - Death   Lolita Rieger, [facility], [place]  [place], et al. (1982). "Toxicity and response criteria of the [facility]". [place] Oncol. 5 (6): 649-55    LABORATORY DATA:  Lab Results  Component Value Date   WBC 18.0 (H) 11/02/2008   HGB 13.7 11/02/2008   HCT 41.6 11/02/2008   MCV 83.9 11/02/2008   PLT 453 (H) 11/02/2008   Lab Results  Component Value Date   NA 135 11/02/2008   K 4.4 11/02/2008   CL 101 11/02/2008   CO2 26 11/02/2008   Lab Results  Component Value Date   ALT 17 11/01/2008   AST 19 11/01/2008   ALKPHOS 47 11/01/2008   BILITOT 0.5 11/01/2008      RADIOGRAPHY: No results found.     IMPRESSION/PLAN: 1. Stage IA, cT1bN0M0 NSCLC, squamous cell carcinoma of the RUL. Dr. Lisbeth Renshaw discusses the pathology findings and reviews the nature of early stage lung cancer. We outlined that the standard of care would be to proceed with surgical resection but for patients who choose to avoid surgery or who are not great medical candidates, that stereotactic body radiotherapy is an acceptable alternative. We discussed the risks, benefits, short, and long term effects of radiotherapy, and the patient is interested in proceeding. Dr. Lisbeth Renshaw discusses the  delivery and logistics of radiotherapy and anticipates a course of 3-5 fractions.  The patient is in agreement to proceed, and we will move forward with getting him scheduled.  He will return on Wednesday of next week to proceed with simulation and will signed written consent at that time. 2. History of Stage IIIA, NSCLC of the RUL. He will continue to follow with Dr. Hinton Rao and requests that after SBRT, he resume surveillance with Dr. Hinton Rao in [place].  3. History of Stage I Renal Cell Carcinoma. He also continues with surveillance with Dr. Hinton Rao.   Given current concerns for patient exposure during the COVID-19 pandemic, this encounter was conducted via telephone.  The patient has given verbal consent for this type of encounter. The time spent during this encounter was 30 minutes and 50% of that time was spent in the coordination of his care. The attendants for this meeting included Dr. Lisbeth Renshaw,  Shona [place], [facility] and Tanna Savoy Amalia Hailey. His sister Amy Kampf was also in attendance for the call.  During the encounter, Dr. Lisbeth Renshaw and Shona Simpson [facility] were located at [facility] Radiation Oncology Department.  Tanna Savoy Amalia Hailey. and Amy Mcpheeters were located at home.  The above documentation reflects my direct findings during this shared patient visit. Please see the separate note by Dr. Lisbeth Renshaw on this date for the remainder of the patient's plan of care.    Carola Rhine, PAC

## 2019-03-01 ENCOUNTER — Ambulatory Visit
Admission: RE | Admit: 2019-03-01 | Discharge: 2019-03-01 | Disposition: A | Discharge status: Home / Self Care | Payer: Medicare Other | Source: Ambulatory Visit | Attending: Radiation Oncology | Admitting: Radiation Oncology

## 2019-03-01 ENCOUNTER — Other Ambulatory Visit: Payer: Self-pay

## 2019-03-01 DIAGNOSIS — C3411 Malignant neoplasm of upper lobe, right bronchus or lung: Secondary | ICD-10-CM | POA: Diagnosis not present

## 2019-03-01 DIAGNOSIS — Z510 Encounter for antineoplastic radiation therapy: Secondary | ICD-10-CM | POA: Diagnosis not present

## 2019-03-02 NOTE — Progress Notes (Signed)
[place] Radiation Oncology Simulation and Treatment Planning Note   Name:  Jeremy Logan. MRN: [id number]   Date: 03/01/2019  DOB: [birth year]  Status:outpatient    DIAGNOSIS:    ICD-10-CM   1. Malignant neoplasm of upper lobe of right lung ([place])  C34.11      CONSENT VERIFIED:yes   SET UP: Patient is setup supine   IMMOBILIZATION: The patient was immobilized using a customized Vac Loc bag/ blue bag and customized accuform device   NARRATIVE:The patient was brought to the [facility] [place].  Identity was confirmed.  All relevant records and images related to the planned course of therapy were reviewed.  Then, the patient was positioned in a stable reproducible clinical set-up for radiation therapy. Abdominal compression was applied by me.  4D CT images were obtained and reproducible breathing pattern was confirmed. Free breathing CT images were obtained.  Skin markings were placed.  The CT images were loaded into the planning software where the target and avoidance structures were contoured.  The radiation prescription was entered and confirmed.    TREATMENT PLANNING NOTE:  Treatment planning then occurred. I have requested : IMRT planning.This treatment technique is medically necessary due to the high-dose of radiation delivered to the target region which is in close proximity to adjacent critical normal structures.  3 dimensional simulation is performed and dose volume histogram of the gross tumor volume, planning tumor volume and criticial normal structures including the spinal cord and lungs were analyzed and requested.  Special treatment procedure was performed due to high dose per fraction.  The patient will be monitored for increased risk of toxicity.  Daily imaging using cone beam CT will be used for target localization.  I anticipate that the patient will receive 60 Gy in 5 fractions to target volume. Further adjustments will be made  based on the planning process is necessary.  ------------------------------------------------  Jodelle Gross, MD, PhD

## 2019-03-02 NOTE — Progress Notes (Signed)
  Radiation Oncology         [phone]) [phone] ________________________________  Name: Jeremy Logan. MRN: [id number]  Date: 03/01/2019  DOB: January 28, 1961  RESPIRATORY MOTION MANAGEMENT SIMULATION  NARRATIVE:  In order to account for effect of respiratory motion on target structures and other organs in the planning and delivery of radiotherapy, this patient underwent respiratory motion management simulation.  To accomplish this, when the patient was brought to the CT simulation planning suite, 4D respiratoy motion management CT images were obtained.  The CT images were loaded into the planning software.  Then, using a variety of tools including Cine, MIP, and standard views, the target volume and planning target volumes (PTV) were delineated.  Avoidance structures were contoured.  Treatment planning then occurred.  Dose volume histograms were generated and reviewed for each of the requested structure.  The resulting plan was carefully reviewed and approved today.   ------------------------------------------------  Jodelle Gross, MD, PhD

## 2019-03-07 DIAGNOSIS — Z510 Encounter for antineoplastic radiation therapy: Secondary | ICD-10-CM | POA: Diagnosis not present

## 2019-03-08 ENCOUNTER — Ambulatory Visit: Payer: Medicare Other | Admitting: Radiation Oncology

## 2019-03-09 ENCOUNTER — Ambulatory Visit
Admission: RE | Admit: 2019-03-09 | Discharge: 2019-03-09 | Disposition: A | Discharge status: Home / Self Care | Payer: Medicare Other | Source: Ambulatory Visit | Attending: Radiation Oncology | Admitting: Radiation Oncology

## 2019-03-09 DIAGNOSIS — Z510 Encounter for antineoplastic radiation therapy: Secondary | ICD-10-CM | POA: Diagnosis not present

## 2019-03-10 ENCOUNTER — Ambulatory Visit: Payer: Medicare Other | Admitting: Radiation Oncology

## 2019-03-14 ENCOUNTER — Ambulatory Visit
Admission: RE | Admit: 2019-03-14 | Discharge: 2019-03-14 | Disposition: A | Discharge status: Home / Self Care | Payer: Medicare Other | Source: Ambulatory Visit | Attending: Radiation Oncology | Admitting: Radiation Oncology

## 2019-03-14 ENCOUNTER — Other Ambulatory Visit: Payer: Self-pay

## 2019-03-14 DIAGNOSIS — Z510 Encounter for antineoplastic radiation therapy: Secondary | ICD-10-CM | POA: Diagnosis not present

## 2019-03-15 ENCOUNTER — Ambulatory Visit: Payer: Medicare Other | Admitting: Radiation Oncology

## 2019-03-16 ENCOUNTER — Ambulatory Visit
Admission: RE | Admit: 2019-03-16 | Discharge: 2019-03-16 | Disposition: A | Discharge status: Home / Self Care | Payer: Medicare Other | Source: Ambulatory Visit | Attending: Radiation Oncology | Admitting: Radiation Oncology

## 2019-03-16 ENCOUNTER — Other Ambulatory Visit: Payer: Self-pay

## 2019-03-16 DIAGNOSIS — Z510 Encounter for antineoplastic radiation therapy: Secondary | ICD-10-CM | POA: Diagnosis not present

## 2019-03-21 ENCOUNTER — Other Ambulatory Visit: Payer: Self-pay

## 2019-03-21 ENCOUNTER — Ambulatory Visit
Admission: RE | Admit: 2019-03-21 | Discharge: 2019-03-21 | Disposition: A | Discharge status: Home / Self Care | Payer: Medicare Other | Source: Ambulatory Visit | Attending: Radiation Oncology | Admitting: Radiation Oncology

## 2019-03-21 DIAGNOSIS — Z510 Encounter for antineoplastic radiation therapy: Secondary | ICD-10-CM | POA: Diagnosis not present

## 2019-03-23 ENCOUNTER — Other Ambulatory Visit: Payer: Self-pay

## 2019-03-23 ENCOUNTER — Ambulatory Visit
Admission: RE | Admit: 2019-03-23 | Discharge: 2019-03-23 | Disposition: A | Discharge status: Home / Self Care | Payer: Medicare Other | Source: Ambulatory Visit | Attending: Radiation Oncology | Admitting: Radiation Oncology

## 2019-03-23 ENCOUNTER — Encounter: Payer: Self-pay | Admitting: Radiation Oncology

## 2019-03-23 DIAGNOSIS — Z510 Encounter for antineoplastic radiation therapy: Secondary | ICD-10-CM | POA: Diagnosis not present

## 2019-05-10 NOTE — Progress Notes (Signed)
  Radiation Oncology         [phone] ________________________________  Name: Jeremy Logan. MRN: [id number]  Date: 03/23/2019  DOB: [birth year]  End of Treatment Note  Diagnosis:   Lung cancer  Indication for treatment::  curative       Radiation treatment dates:   03/09/19 - 03/23/19  Site/dose:   The patient was treated to the right lung with a course of stereotactic body radiation treatment.  The patient received 50 Gray in 5 fractions using a IMRT technique, with 3 fields.  Narrative: The patient tolerated radiation treatment relatively well.   No unexpected difficulties.  The patient's breathing did not significantly change during the course of the treatment.  Plan: The patient has completed radiation treatment. The patient will return to radiation oncology clinic for routine followup in one month. I advised the patient to call or return sooner if they have any questions or concerns related to their recovery or treatment. ________________________________  Jodelle Gross, M.D., Ph.D.

## 2019-06-21 DIAGNOSIS — C3412 Malignant neoplasm of upper lobe, left bronchus or lung: Secondary | ICD-10-CM

## 2019-10-17 DIAGNOSIS — R198 Other specified symptoms and signs involving the digestive system and abdomen: Secondary | ICD-10-CM | POA: Insufficient documentation

## 2019-11-03 DIAGNOSIS — C3411 Malignant neoplasm of upper lobe, right bronchus or lung: Secondary | ICD-10-CM | POA: Diagnosis not present

## 2020-04-30 NOTE — Progress Notes (Incomplete)
[facility]  [address] [place],  [place]  [id number] [phone]  Clinic Day:  04/30/2020  Referring physician: Algis Greenhouse, MD  This document serves as a record of services personally performed by Hosie Poisson, MD. It was created on their behalf by [name],[name], a trained medical scribe. The creation of this record is based on the scribe's personal observations and the provider's statements to them.  CHIEF COMPLAINT:  CC: Stage I non small cell lung cancer in the right upper lobe  Current Treatment:  Surveillance   HISTORY OF PRESENT ILLNESS:  Jeremy Logan. is a 60 y.o. male with a history of  stage IIIA non-small cell lung cancer diagnosed in August of 2010 when he presented with a large Pancoast tumor measuring over 8 cm in diameter with destruction of T1 and T2 vertebral bodies.  He received concurrent chemotherapy and radiation with weekly carboplatin and paclitaxel and completed 8 cycles of the chemotherapy.  He has never had any evidence of recurrence, but continues to have severe pain of the right shoulder and neck, which is felt to be from long-term neurologic damage.  He was incidentally found to have a stage I renal cell carcinoma which was treated with a partial left nephrectomy in January 2012 at [facility], and he has not had any recurrence of that either.  However, he has chronic pain of pain in the left flank where his abdomen bulges from his surgery.  He also had an umbilical hernia which became incarcerated and required surgery in 2012. He had opioid dependence, but was tapered off his narcotics last year.  He is not a candidate for methadone because of his other medications.  He also has a diagnosis of depression and bipolar disease.  CT chest in March 2016 revealed no evidence of malignancy, but he had extensive damage to the upper thoracic spine from his prior malignancy.  He was extremely short of breath when  he saw Dr. Hinton Rao in November 2016 and ended up hospitalized for acute respiratory failure for 7 weeks through January 2017.  He ended up with a temporary tracheostomy.  He presented to the emergency room in March 2017 with increasing dyspnea and chest pain.  CTA chest did not reveal any evidence of malignancy or pulmonary embolism and was otherwise was stable.  He has diabetes, probably steroid induced, as he has frequently on and off them.  He is insulin dependent.  In August 2017, he had severe swelling of the left lower extremity and was found to have extensive acute deep venous thrombosis throughout most of the leg.  He was later seen in the emergency room due to dyspnea and CT a chest revealed pulmonary emboli with evidence of right heart strain.  He was hospitalized on intravenous heparin, he was switched him to Lovenox upon discharge and later switched to Xarelto.  Repeat CTA chest in February 2018 revealed resolution of the pulmonary embolism.  As he had been on anticoagulation for 6 months, Xarelto was discontinued at that time.  He presented to the emergency room with dyspnea and chest pain again in November 2018. CTA chest did not reveal any evidence of pulmonary embolism.  There was a new 7 mm right upper lobe nodule.  Repeat CT imaging in March 2019 revealed new mild mediastinal and bilateral hilar adenopathy measuring up to 1.4 cm in diameter.  An AP window lymph node had increased from 8 mm to 13 mm.  There was enlargement of spiculated nodule in the anterior right upper lobe measuring 1.5 cm, compared to 0.9 cm previously.  There were stable post radiation changes in the right upper paramediastinal area.  There were radiation changes of the right posterior 2nd rib and T2 vertebral body.  The CEA was up to 6.2.  He underwent PET scan for further evaluation.  This did not reveal hypermetabolic activity in the right upper lobe measuring 14 mm.  There was a focus of consolidation in the right upper  lobe suggestive of radiation changes, also without hypermetabolic activity.  No hypermetabolic activity was seen in the mediastinal lymph nodes.  When he was seen in May of 2019, the CEA had gone back down to 3.2.  CT chest in July revealed a slight decrease in the right upper lobe pulmonary nodule, from 1.5 cm to 1.3 cm. Repeat CT chest in October revealed further decrease of the right upper lobe nodule, now measuring 1.1 cm, as well as a 9 mm subcarinal node, which is not pathologically enlarged.  He quit smoking about 2 years ago.  Unfortunately, he does vape.  His CT scan from June 16th 2020 revealed that the anterior right lobe pulmonary nodule has progressed in the interval but it has been nearly 9 months since his previous scans in October.  Since it has only increased slightly, we recommended continued observation.  CT scan from October 16th 2020 revealed an interval increase in size of a spiculated nodule of the anterior right upper lobe measuring 1.8 x 0.9 cm, previously 0.9 x 0.7 cm, highly concerning for recurrent or metachronous primary lung malignancy.  He then underwent a PET scan which showed the 1.8 cm spiculated right upper lobe nodule, suspicious for recurrent or metachronous primary bronchogenic neoplasm, less likely metastasis.  The SUV was measured to be 6.1.  His CEA continues to be within the normal range, the last being 1.9, so I recommended a biopsy.  He has already received radiation 10 years ago here in the clinic, however, he may be a candidate for stereotactic radiation at [facility].    He underwent a CT guided biopsy on the isolated right lung nodule on November 10th.  This is consistent with a non-small cell type, favor squamous cell.  This is likely a second primary lung cancer, but we have caught this early and appears to be a stage I.  The original pathology from 10 years ago was read as large cell lung carcinoma.  Since this is an isolated nodule, I would not recommend  chemotherapy.  However, I am unsure whether he may not be able to tolerate surgical resection with his comorbidities and prior treatment.  He was treated with stereotactic radiation at [facility].  PFT's are consistent with asthma among other diagnoses.  He completed stereotactic radiation at [facility] in [place] in December 2020.  CT imaging from March 2021 revealed slight interval decrease in the right upper lobe nodule now measuring 1.8 x 1.0 cm, previously 2.1 x 1.1 cm.  There is a new bandlike opacity extending peripherally to the adjacent pleura may represent interval post radiation changes.  He is here for follow up and to discuss recent imaging results.  CT chest from August 11th revealed increased soft tissue density at the site of anterior right upper lobe treated pulmonary nodule, favored to be secondary to evolving radiation change.  Labs from the same day revealed a normal CBC except for a white count of 12.6, previously  9.0.  CMP was unremarkable except for a BUN of 26, a blood glucose of 152, and a mildly elevated SGOT of 53, previously 22.  CEA was normal at 2.3.  He states that he has been well and reports shortness of breath.  He also notes an abdominal wall hernia.  His appetite is good, and he has lost 5 pounds since his last visit.  He denies fever or chills.  He denies nausea, vomiting, bowel issues, or abdominal pain.  He denies sore throat, cough or chest pain.  INTERVAL HISTORY:  Jeremy Logan is here for routine follow up ***.   His  appetite is good, and he has gained/lost _ pounds since his last visit.  He denies fever, chills or other signs of infection.  He denies nausea, vomiting, bowel issues, or abdominal pain.  He denies sore throat, cough, dyspnea, or chest pain.  REVIEW OF SYSTEMS:  Review of Systems - Oncology   VITALS:  There were no vitals taken for this visit.  Wt Readings from Last 3 Encounters:  02/21/19 275 lb (124.7 kg)    There is no height or weight on  file to calculate BMI.  Performance status (ECOG): {CHL ONC [id number]  PHYSICAL EXAM:  Physical Exam  LABS:   CBC Latest Ref Rng & Units 11/02/2008 11/01/2008 10/31/2008  WBC 4.0 - 10.5 K/uL 18.0(H) 17.3(H) 15.2(H)  Hemoglobin 13.0 - 17.0 g/dL 13.7 13.9 13.5  Hematocrit 39.0 - 52.0 % 41.6 42.3 40.9  Platelets 150 - 400 K/uL 453(H) 484(H) 471(H)   CMP Latest Ref Rng & Units 11/02/2008 11/01/2008 10/31/2008  Glucose 70 - 99 mg/dL 124(H) 129(H) 119(H)  BUN 6 - 23 mg/dL 21 16 17   Creatinine 0.4 - 1.5 mg/dL 0.82 0.68 0.76  Sodium 135 - 145 mEq/L 135 135 133(L)  Potassium 3.5 - 5.1 mEq/L 4.4 4.2 4.3  Chloride 96 - 112 mEq/L 101 100 97  CO2 19 - 32 mEq/L 26 28 29   Calcium 8.4 - 10.5 mg/dL 8.9 9.0 9.1  Total Protein 6.0 - 8.3 g/dL - 6.6 -  Total Bilirubin 0.3 - 1.2 mg/dL - 0.5 -  Alkaline Phos 39 - 117 U/L - 47 -  AST 0 - 37 U/L - 19 -  ALT 0 - 53 U/L - 17 -     No results found for: CEA1 / No results found for: CEA1 No results found for: PSA1 No results found for: HMC947 No results found for: SJG283  No results found for: TOTALPROTELP, ALBUMINELP, A1GS, A2GS, BETS, BETA2SER, GAMS, MSPIKE, SPEI No results found for: TIBC, FERRITIN, IRONPCTSAT No results found for: LDH   STUDIES:  No results found.   Allergies: No Known Allergies  Current Medications: Current Outpatient Medications  Medication Sig Dispense Refill  . aspirin EC 81 MG tablet Take 81 mg by mouth daily.    Marland Kitchen atorvastatin (LIPITOR) 80 MG tablet Take 80 mg by mouth daily at 6 PM.     . carvedilol (COREG) 3.125 MG tablet Take 3.125 mg by mouth 2 (two) times daily with a meal.     . Dulaglutide (TRULICITY) 1.5 MO/2.9UT SOPN Inject 1.5 mg into the skin every 7 (seven) days.     Marland Kitchen FLUoxetine (PROZAC) 20 MG capsule Take 40 mg by mouth 2 (two) times daily.    . furosemide (LASIX) 20 MG tablet Take 20 mg by mouth.    . gabapentin (NEURONTIN) 300 MG capsule Take 300 mg by mouth 2 (two) times daily.     Marland Kitchen  insulin  aspart (NOVOLOG) 100 UNIT/ML injection Inject 20 Units into the skin 3 (three) times daily before meals.    . Insulin Detemir (LEVEMIR FLEXTOUCH) 100 UNIT/ML Pen Inject 80 Units into the skin daily.     Marland Kitchen OLANZapine (ZYPREXA) 20 MG tablet Take 10 mg by mouth at bedtime.     Marland Kitchen spironolactone (ALDACTONE) 25 MG tablet Take 25 mg by mouth daily.     No current facility-administered medications for this visit.     ASSESSMENT & PLAN:   Assessment:   1. 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     2. 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  3. Stage I renal cell carcinoma January 2012 treated with a partial left nephrectomy.  He has not had evidence of recurrence.  4. Severe COPD.  He did have an episode of respiratory failure in November of 2016 requiring a temp tracheostomy.  5. Deep venous thrombosis in August of 2017 with associated pulmonary emboli.  6. Depression and bipolar disease.  7. Severe insulin-dependent diabetes mellitus, well controlled at this time with decreasing doses of insulin.    8. History of substance abuse.  9. Surgery for umbilical incarcerated hernia in 2012.    10. Stage I Non small cell lung cancer in the right upper lobe, diagnosed in November 2020. CEA was normal.  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  This was confirmed to be an isolated lesion and he completed stereotactic radiation at [facility] in [place] in December 2020.   Plan: Recent CT imaging revealed increased soft tissue density at the site of anterior right upper lobe treated pulmonary nodule.  This is favored to be secondary to evolving radiation change.  His port was not flushed with his scan, so we will flush this today.  We will see him back in 6 months with CBC, CMP,  CEA, and CT thorax for reevaluation.  The patient understands the plans discussed today and is in agreement with them.  He knows to contact our office if he develops concerns prior to his next appointment.   I provided *** minutes (11:10 AM - 11:10 AM) of face-to-face time during this this encounter and > 50% was spent counseling as documented under my assessment and plan.    Derwood Kaplan, MD [facility] AT [facility] [address] [place] [address] Dept: [phone] Dept Fax: [phone]   I, Rita Ohara, am acting as scribe for Derwood Kaplan, MD  I have reviewed this report as typed by the medical scribe, and it is complete and accurate.

## 2020-05-02 ENCOUNTER — Inpatient Hospital Stay: Payer: Medicare Other

## 2020-05-02 ENCOUNTER — Telehealth: Payer: Self-pay | Admitting: Oncology

## 2020-05-02 ENCOUNTER — Inpatient Hospital Stay: Payer: Medicare Other | Admitting: Oncology

## 2020-05-02 NOTE — Telephone Encounter (Signed)
05/02/20 Soke with sister and rs appt

## 2020-05-13 NOTE — Progress Notes (Incomplete)
[facility]  [address] [place],  [place]  [address] [phone]  Clinic Day:  05/13/2020  Referring physician: Algis Greenhouse, MD  This document serves as a record of services personally performed by Hosie Poisson, MD. It was created on their behalf by [name],[name], a trained medical scribe. The creation of this record is based on the scribe's personal observations and the provider's statements to them.  CHIEF COMPLAINT:  CC: Stage I non-small cell lung cancer  Current Treatment:  Surveillance   HISTORY OF PRESENT ILLNESS:  Jeremy Logan. is a 60 y.o. male with stage IIIA non-small cell lung cancer diagnosed in August of 2010 when he presented with a large Pancoast tumor measuring over 8 cm in diameter with destruction of T1 and T2 vertebral bodies.  He received concurrent chemotherapy and radiation with weekly carboplatin and paclitaxel and completed 8 cycles of the chemotherapy.  He has never had any evidence of recurrence, but continues to have severe pain of the right shoulder and neck, which is felt to be from long-term neurologic damage.  He was incidentally found to have a stage I renal cell carcinoma which was treated with a partial left nephrectomy in January 2012 at [facility], and he has not had any recurrence of that either.  However, he has chronic pain of pain in the left flank where his abdomen bulges from his surgery.  He also had an umbilical hernia which became incarcerated and required surgery in 2012. He had opioid dependence, but was tapered off his narcotics last year.  He is not a candidate for methadone because of his other medications.  He also has a diagnosis of depression and bipolar disease.  CT chest in March 2016 revealed no evidence of malignancy, but he had extensive damage to the upper thoracic spine from his prior malignancy.  He was extremely short of breath when he saw Dr. Hinton Rao in November 2016  and ended up hospitalized for acute respiratory failure for 7 weeks through January 2017.  He ended up with a temporary tracheostomy.  He presented to the emergency room in March 2017 with increasing dyspnea and chest pain.  CTA chest did not reveal any evidence of malignancy or pulmonary embolism and was otherwise was stable.  He has diabetes, probably steroid induced, as he has frequently on and off them.  He is insulin dependent.  In August 2017, he had severe swelling of the left lower extremity and was found to have extensive acute deep venous thrombosis throughout most of the leg.  He was later seen in the emergency room due to dyspnea and CT a chest revealed pulmonary emboli with evidence of right heart strain.  He was hospitalized on intravenous heparin, he was switched him to Lovenox upon discharge and later switched to Xarelto.  Repeat CTA chest in February 2018 revealed resolution of the pulmonary embolism.  As he had been on anticoagulation for 6 months, Xarelto was discontinued at that time.  He presented to the emergency room with dyspnea and chest pain again in November 2018. CTA chest did not reveal any evidence of pulmonary embolism.  There was a new 7 mm right upper lobe nodule.  Repeat CT imaging in March 2019 revealed new mild mediastinal and bilateral hilar adenopathy measuring up to 1.4 cm in diameter.  An AP window lymph node had increased from 8 mm to 13 mm.  There was enlargement of spiculated nodule in the anterior  right upper lobe measuring 1.5 cm, compared to 0.9 cm previously.  There were stable post radiation changes in the right upper paramediastinal area.  There were radiation changes of the right posterior 2nd rib and T2 vertebral body.  The CEA was up to 6.2.  He underwent PET scan for further evaluation.  This did not reveal hypermetabolic activity in the right upper lobe measuring 14 mm.  There was a focus of consolidation in the right upper lobe suggestive of radiation changes,  also without hypermetabolic activity.  No hypermetabolic activity was seen in the mediastinal lymph nodes.  When he was seen in May of 2019, the CEA had gone back down to 3.2.  CT chest in July revealed a slight decrease in the right upper lobe pulmonary nodule, from 1.5 cm to 1.3 cm. Repeat CT chest in October revealed further decrease of the right upper lobe nodule, now measuring 1.1 cm, as well as a 9 mm subcarinal node, which is not pathologically enlarged.  He quit smoking about 2 years ago.  Unfortunately, he does vape.  His CT scan from June 16th 2020 revealed that the anterior right lobe pulmonary nodule has progressed in the interval but it has been nearly 9 months since his previous scans in October.  Since it has only increased slightly, we recommended continued observation.  CT scan from October 16th 2020 revealed an interval increase in size of a spiculated nodule of the anterior right upper lobe measuring 1.8 x 0.9 cm, previously 0.9 x 0.7 cm, highly concerning for recurrent or metachronous primary lung malignancy.  He then underwent a PET scan which showed the 1.8 cm spiculated right upper lobe nodule, suspicious for recurrent or metachronous primary bronchogenic neoplasm, less likely metastasis.  The SUV was measured to be 6.1.  His CEA continues to be within the normal range, the last being 1.9, so I recommended a biopsy.  He has already received radiation 10 years ago here in the clinic, however, he may be a candidate for stereotactic radiation at [facility].    He underwent a CT guided biopsy on the isolated right lung nodule on November 10th.  This is consistent with a non-small cell type, favor squamous cell.  This is likely a second primary lung cancer, but we have caught this early and appears to be a stage I.  The original pathology from 10 years ago was read as large cell lung carcinoma.  Since this is an isolated nodule, I would not recommend chemotherapy.  However, I am unsure whether  he may not be able to tolerate surgical resection with his comorbidities and prior treatment.  He was treated with stereotactic radiation at [facility].  PFT's are consistent with asthma among other diagnoses.  He completed stereotactic radiation at [facility] in [place] in December 2020.  CT imaging from March 2021 revealed slight interval decrease in the right upper lobe nodule now measuring 1.8 x 1.0 cm, previously 2.1 x 1.1 cm.  There is a new bandlike opacity extending peripherally to the adjacent pleura may represent interval post radiation changes.  He is here for follow up and to discuss recent imaging results.  CT chest from August 2021 revealed increased soft tissue density at the site of anterior right upper lobe treated pulmonary nodule, favored to be secondary to evolving radiation change.  Labs from the same day revealed a normal CBC except for a white count of 12.6, previously 9.0.  CMP was unremarkable except for a BUN  of 26, a blood glucose of 152, and a mildly elevated SGOT of 53, previously 22.  CEA was normal at 2.3.  He states that he has been well and reports shortness of breath.  He also notes an abdominal wall hernia.  His appetite is good, and he has lost 5 pounds since his last visit.  He denies fever or chills.  He denies nausea, vomiting, bowel issues, or abdominal pain.  He denies sore throat, cough or chest pain.  INTERVAL HISTORY:  Jeremy Logan is here for routine follow up ***.   His  appetite is good, and he has gained/lost _ pounds since his last visit.  He denies fever, chills or other signs of infection.  He denies nausea, vomiting, bowel issues, or abdominal pain.  He denies sore throat, cough, dyspnea, or chest pain.  REVIEW OF SYSTEMS:  Review of Systems - Oncology   VITALS:  There were no vitals taken for this visit.  Wt Readings from Last 3 Encounters:  02/21/19 275 lb (124.7 kg)    There is no height or weight on file to calculate BMI.  Performance status  (ECOG): {CHL ONC [id number]  PHYSICAL EXAM:  Physical Exam  LABS:   CBC Latest Ref Rng & Units 11/02/2008 11/01/2008 10/31/2008  WBC 4.0 - 10.5 K/uL 18.0(H) 17.3(H) 15.2(H)  Hemoglobin 13.0 - 17.0 g/dL 13.7 13.9 13.5  Hematocrit 39.0 - 52.0 % 41.6 42.3 40.9  Platelets 150 - 400 K/uL 453(H) 484(H) 471(H)   CMP Latest Ref Rng & Units 11/02/2008 11/01/2008 10/31/2008  Glucose 70 - 99 mg/dL 124(H) 129(H) 119(H)  BUN 6 - 23 mg/dL 21 16 17   Creatinine 0.4 - 1.5 mg/dL 0.82 0.68 0.76  Sodium 135 - 145 mEq/L 135 135 133(L)  Potassium 3.5 - 5.1 mEq/L 4.4 4.2 4.3  Chloride 96 - 112 mEq/L 101 100 97  CO2 19 - 32 mEq/L 26 28 29   Calcium 8.4 - 10.5 mg/dL 8.9 9.0 9.1  Total Protein 6.0 - 8.3 g/dL - 6.6 -  Total Bilirubin 0.3 - 1.2 mg/dL - 0.5 -  Alkaline Phos 39 - 117 U/L - 47 -  AST 0 - 37 U/L - 19 -  ALT 0 - 53 U/L - 17 -     No results found for: CEA1 / No results found for: CEA1 No results found for: PSA1 No results found for: TIR443 No results found for: XVQ008  No results found for: TOTALPROTELP, ALBUMINELP, A1GS, A2GS, BETS, BETA2SER, GAMS, MSPIKE, SPEI No results found for: TIBC, FERRITIN, IRONPCTSAT No results found for: LDH   STUDIES:  No results found.   Allergies: No Known Allergies  Current Medications: Current Outpatient Medications  Medication Sig Dispense Refill  . aspirin EC 81 MG tablet Take 81 mg by mouth daily.    Marland Kitchen atorvastatin (LIPITOR) 80 MG tablet Take 80 mg by mouth daily at 6 PM.     . carvedilol (COREG) 3.125 MG tablet Take 3.125 mg by mouth 2 (two) times daily with a meal.     . Dulaglutide (TRULICITY) 1.5 QP/6.1PJ SOPN Inject 1.5 mg into the skin every 7 (seven) days.     Marland Kitchen FLUoxetine (PROZAC) 20 MG capsule Take 40 mg by mouth 2 (two) times daily.    . furosemide (LASIX) 20 MG tablet Take 20 mg by mouth.    . gabapentin (NEURONTIN) 300 MG capsule Take 300 mg by mouth 2 (two) times daily.     . insulin aspart (NOVOLOG) 100 UNIT/ML injection  Inject 20  Units into the skin 3 (three) times daily before meals.    . Insulin Detemir (LEVEMIR FLEXTOUCH) 100 UNIT/ML Pen Inject 80 Units into the skin daily.     Marland Kitchen OLANZapine (ZYPREXA) 20 MG tablet Take 10 mg by mouth at bedtime.     Marland Kitchen spironolactone (ALDACTONE) 25 MG tablet Take 25 mg by mouth daily.     No current facility-administered medications for this visit.     ASSESSMENT & PLAN:   Assessment:   1. 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     2. 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  3. Stage I renal cell carcinoma January 2012 treated with a partial left nephrectomy.  He has not had evidence of recurrence.  4. Severe COPD.  He did have an episode of respiratory failure in November of 2016 requiring a temp tracheostomy.  5. Deep venous thrombosis in August of 2017 with associated pulmonary emboli.  6. Depression and bipolar disease.  7. Severe insulin-dependent diabetes mellitus, well controlled at this time with decreasing doses of insulin.    8. History of substance abuse.  9. Surgery for umbilical incarcerated hernia in 2012.    10. Stage I Non small cell lung cancer in the right upper lobe, diagnosed in November 2020. CEA was normal.  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  This was confirmed to be an isolated lesion and he completed stereotactic radiation at [facility] in [place] in December 2020.   Plan: Recent CT imaging revealed increased soft tissue density at the site of anterior right upper lobe treated pulmonary nodule.  This is favored to be secondary to evolving radiation change.  His port was not flushed with his scan, so we will flush this today.  We will see him back in 6 months with CBC, CMP, CEA, and CT thorax for reevaluation.  The patient  understands the plans discussed today and is in agreement with them.  He knows to contact our office if he develops concerns prior to his next appointment.   I provided *** minutes of face-to-face time during this this encounter and > 50% was spent counseling as documented under my assessment and plan.    Derwood Kaplan, MD [facility] [address] [place] [address] Dept: [phone] Dept Fax: [phone]   I, Rita Ohara, am acting as scribe for Derwood Kaplan, MD  I have reviewed this report as typed by the medical scribe, and it is complete and accurate.  Hermina Barters

## 2020-05-16 ENCOUNTER — Inpatient Hospital Stay: Payer: Medicare Other | Admitting: Oncology

## 2020-05-16 ENCOUNTER — Inpatient Hospital Stay: Payer: Medicare Other

## 2020-05-30 NOTE — Progress Notes (Signed)
[facility]  [address] [place],  [place]  [address] [phone]  Clinic Day:  06/03/2020  Referring physician: Algis Greenhouse, MD  This document serves as a record of services personally performed by Hosie Poisson, MD. It was created on their behalf by [name],[name], a trained medical scribe. The creation of this record is based on the scribe's personal observations and the provider's statements to them.  CHIEF COMPLAINT:  CC: History of multiple malignancies, most recently a stage I non small cell lung cancer in the right upper lobe  Current Treatment:  Surveillance  HISTORY OF PRESENT ILLNESS:  Jeremy Logan. is a 60 y.o. male with a history of stage IIIA non-small cell lung cancer diagnosed in August of 2010 when he presented with a large Pancoast tumor measuring over 8 cm in diameter with destruction of T1 and T2 vertebral bodies.  He received concurrent chemotherapy and radiation with weekly carboplatin and paclitaxel and completed 8 cycles of the chemotherapy.  He has never had any evidence of recurrence, but continues to have severe pain of the right shoulder and neck, which is felt to be from long-term neurologic damage.  He was incidentally found to have a stage I renal cell carcinoma which was treated with a partial left nephrectomy in January 2012 at [facility], and he has not had any recurrence of that either.  However, he has chronic pain of pain in the left flank where his abdomen bulges from his surgery.  He also had an umbilical hernia which became incarcerated and required surgery in 2012. He had opioid dependence, but was tapered off his narcotics last year.  He is not a candidate for methadone because of his other medications.  He also has a diagnosis of depression and bipolar disease.  CT chest in March 2016 revealed no evidence of malignancy, but he had extensive damage to the upper thoracic spine from his prior  malignancy.  He was extremely short of breath when he saw Dr. Hinton Rao in November 2016 and ended up hospitalized for acute respiratory failure for 7 weeks through January 2017.  He ended up with a temporary tracheostomy.  He is insulin dependent diabetic, likely from frequent steroids.  In August 2017, he had severe swelling of the left lower extremity and was found to have extensive acute deep venous thrombosis throughout most of the leg.  He was later seen in the emergency room due to dyspnea and CT a chest revealed pulmonary emboli with evidence of right heart strain.  He was hospitalized on intravenous heparin, he was switched him to Lovenox upon discharge and later switched to Xarelto.  Repeat CTA chest in February 2018 revealed resolution of the pulmonary embolism.  As he had been on anticoagulation for 6 months, Xarelto was discontinued at that time.  He presented to the emergency room with dyspnea and chest pain again in November 2018. CTA chest did not reveal any evidence of pulmonary embolism.  There was a new 7 mm right upper lobe nodule.  Repeat CT imaging in March 2019 revealed new mild mediastinal and bilateral hilar adenopathy measuring up to 1.4 cm in diameter.  An AP window lymph node had increased from 8 mm to 13 mm.  There was enlargement of spiculated nodule in the anterior right upper lobe measuring 1.5 cm, compared to 0.9 cm previously.  There were stable post radiation changes in the right upper paramediastinal area.  There were radiation changes  of the right posterior 2nd rib and T2 vertebral body.  The CEA was up to 6.2.  He underwent PET scan for further evaluation.  This did not reveal hypermetabolic activity in the right upper lobe measuring 14 mm.  There was a focus of consolidation in the right upper lobe suggestive of radiation changes, also without hypermetabolic activity.  No hypermetabolic activity was seen in the mediastinal lymph nodes.  When he was seen in May of 2019, the CEA  had gone back down to 3.2.  CT chest in July revealed a slight decrease in the right upper lobe pulmonary nodule, from 1.5 cm to 1.3 cm. Repeat CT chest in October revealed further decrease of the right upper lobe nodule, now measuring 1.1 cm, as well as a 9 mm subcarinal node, which is not pathologically enlarged.  He quit smoking about 2 years ago.  Unfortunately, he does vape.  His CT scan from June 16th 2020 revealed that the anterior right lobe pulmonary nodule had progressed in the interva.  CT scan from October 16th 2020 revealed an interval increase in size of a spiculated nodule of the anterior right upper lobe measuring 1.8 x 0.9 cm, previously 0.9 x 0.7 cm, highly concerning for recurrent or metachronous primary lung malignancy.  He then underwent a PET scan which showed the 1.8 cm spiculated right upper lobe nodule, suspicious for recurrent or metachronous primary bronchogenic neoplasm, less likely metastasis.  The SUV was measured to be 6.1.  His CEA continues to be within the normal range, the last being 1.9.  He underwent a CT guided biopsy on the isolated right lung nodule on November 10th.  This is consistent with a non-small cell type, favor squamous cell.  This is likely a second primary lung cancer, but appears to be a stage I.  The original pathology from 10 years ago was read as large cell lung carcinoma.  Since this is an isolated nodule, I would not recommend chemotherapy and he is not a good candidate for surgical resection.  He was treated with stereotactic radiation at [facility].  PFT's are consistent with asthma among other diagnoses.  He completed stereotactic radiation at [facility] in [place] in December 2020.  CT imaging from March 2021 revealed slight interval decrease in the right upper lobe nodule now measuring 1.8 x 1.0 cm, previously 2.1 x 1.1 cm.  CT chest from August 11th revealed increased soft tissue density at the site of anterior right upper lobe treated pulmonary  nodule, favored to be secondary to evolving radiation change.    INTERVAL HISTORY:  Jeremy Logan is here for routine follow up and states that he has been well and denies complaints other than mild left flank pain secondary to abdominal wall hernia, and poor memory.  His CEA has remained normal.  Blood counts and chemistries are unremarkable.  His  appetite is good, and he has gained 1 pound since his last visit.  He denies fever, chills or other signs of infection.  He denies nausea, vomiting, bowel issues, or abdominal pain.  He denies sore throat, cough, dyspnea, or chest pain.  REVIEW OF SYSTEMS:  Review of Systems  Constitutional: Negative.  Negative for appetite change, chills, fatigue, fever and unexpected weight change.  HENT:  Negative.   Eyes: Negative.   Respiratory: Negative.  Negative for chest tightness, cough, hemoptysis, shortness of breath and wheezing.   Cardiovascular: Negative.  Negative for chest pain, leg swelling and palpitations.  Gastrointestinal: Negative.  Negative  for abdominal distention, abdominal pain, blood in stool, constipation, diarrhea, nausea and vomiting.  Endocrine: Negative.   Genitourinary: Negative for difficulty urinating, dysuria, frequency and hematuria.   Musculoskeletal: Positive for flank pain (left, due to abdominal wall hernia). Negative for arthralgias, back pain, gait problem and myalgias.  Skin: Negative.   Neurological: Negative.  Negative for dizziness, extremity weakness, gait problem, headaches, light-headedness, numbness, seizures and speech difficulty.  Hematological: Negative.   Psychiatric/Behavioral: Negative for depression and sleep disturbance. The patient is not nervous/anxious.        Poor memory, stable  All other systems reviewed and are negative.    VITALS:  Blood pressure 130/70, pulse 75, temperature 97.9 F (36.6 C), temperature source Oral, resp. rate 18, height 5\' 8"  (1.727 m), weight 265 lb 14.4 oz (120.6 kg), SpO2 95 %.   Wt Readings from Last 3 Encounters:  06/03/20 265 lb 14.4 oz (120.6 kg)  02/21/19 275 lb (124.7 kg)    Body mass index is 40.43 kg/m.  Performance status (ECOG): 1 - Symptomatic but completely ambulatory  PHYSICAL EXAM:  Physical Exam Constitutional:      General: He is not in acute distress.    Appearance: Normal appearance. He is normal weight.  HENT:     Head: Normocephalic and atraumatic.  Eyes:     General: No scleral icterus.    Extraocular Movements: Extraocular movements intact.     Conjunctiva/sclera: Conjunctivae normal.     Pupils: Pupils are equal, round, and reactive to light.  Cardiovascular:     Rate and Rhythm: Normal rate and regular rhythm.     Pulses: Normal pulses.     Heart sounds: Normal heart sounds. No murmur heard. No friction rub. No gallop.   Pulmonary:     Effort: Pulmonary effort is normal. No respiratory distress.     Breath sounds: Normal breath sounds.  Abdominal:     General: Bowel sounds are normal. There is no distension.     Palpations: Abdomen is soft. There is no hepatomegaly, splenomegaly or mass.     Tenderness: There is no abdominal tenderness.     Comments: He has a left abdominal wall hernia at the site of nephrectomy  Musculoskeletal:        General: Normal range of motion.     Cervical back: Normal range of motion and neck supple.     Right lower leg: No edema.     Left lower leg: No edema.  Lymphadenopathy:     Cervical: No cervical adenopathy.  Skin:    General: Skin is warm and dry.  Neurological:     General: No focal deficit present.     Mental Status: He is alert and oriented to person, place, and time. Mental status is at baseline.  Psychiatric:        Mood and Affect: Mood normal.        Behavior: Behavior normal.        Thought Content: Thought content normal.        Judgment: Judgment normal.     LABS:   CBC Latest Ref Rng & Units 11/02/2008 11/01/2008 10/31/2008  WBC 4.0 - 10.5 K/uL 18.0(H) 17.3(H) 15.2(H)   Hemoglobin 13.0 - 17.0 g/dL 13.7 13.9 13.5  Hematocrit 39.0 - 52.0 % 41.6 42.3 40.9  Platelets 150 - 400 K/uL 453(H) 484(H) 471(H)   CMP Latest Ref Rng & Units 11/02/2008 11/01/2008 10/31/2008  Glucose 70 - 99 mg/dL 124(H) 129(H) 119(H)  BUN 6 -  23 mg/dL 21 16 17   Creatinine 0.4 - 1.5 mg/dL 0.82 0.68 0.76  Sodium 135 - 145 mEq/L 135 135 133(L)  Potassium 3.5 - 5.1 mEq/L 4.4 4.2 4.3  Chloride 96 - 112 mEq/L 101 100 97  CO2 19 - 32 mEq/L 26 28 29   Calcium 8.4 - 10.5 mg/dL 8.9 9.0 9.1  Total Protein 6.0 - 8.3 g/dL - 6.6 -  Total Bilirubin 0.3 - 1.2 mg/dL - 0.5 -  Alkaline Phos 39 - 117 U/L - 47 -  AST 0 - 37 U/L - 19 -  ALT 0 - 53 U/L - 17 -     No results found for: CEA1 / No results found for: CEA1   STUDIES:  No results found.   Allergies: No Known Allergies  Current Medications: Current Outpatient Medications  Medication Sig Dispense Refill  . ezetimibe (ZETIA) 10 MG tablet TAKE 1 TABLET BY MOUTH DAILY FOR CHOLESTEROL    . aspirin EC 81 MG tablet Take 81 mg by mouth daily.    Marland Kitchen atorvastatin (LIPITOR) 80 MG tablet Take 80 mg by mouth daily at 6 PM.     . carvedilol (COREG) 3.125 MG tablet Take 3.125 mg by mouth 2 (two) times daily with a meal.     . Dulaglutide (TRULICITY) 1.5 RW/4.3XV SOPN Inject 1.5 mg into the skin every 7 (seven) days.     Marland Kitchen FLUoxetine (PROZAC) 20 MG capsule Take 40 mg by mouth 2 (two) times daily.    . furosemide (LASIX) 20 MG tablet Take 20 mg by mouth.    . gabapentin (NEURONTIN) 300 MG capsule Take 300 mg by mouth 2 (two) times daily.     . insulin aspart (NOVOLOG) 100 UNIT/ML injection Inject 20 Units into the skin 3 (three) times daily before meals.    . Insulin Detemir (LEVEMIR FLEXTOUCH) 100 UNIT/ML Pen Inject 80 Units into the skin daily.     Marland Kitchen OLANZapine (ZYPREXA) 10 MG tablet Take 10 mg by mouth at bedtime.    Marland Kitchen spironolactone (ALDACTONE) 25 MG tablet Take 25 mg by mouth daily.     No current facility-administered medications for this visit.      ASSESSMENT & PLAN:   Assessment:   1. 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     2. 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  3. Stage I renal cell carcinoma January 2012 treated with a partial left nephrectomy.  He has not had evidence of recurrence.  4. Severe COPD.  He did have an episode of respiratory failure in November of 2016 requiring a temp tracheostomy.  5. Deep venous thrombosis in August of 2017 with associated pulmonary emboli.  6. Depression and bipolar disease.  7. Severe insulin-dependent diabetes mellitus, well controlled at this time with decreasing doses of insulin.    8. History of substance abuse.  9. Surgery for incarcerated umbilical hernia in 4008.    10. Stage I Non small cell lung cancer in the right upper lobe, diagnosed in November 2020. CEA was normal.  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  It was confirmed to be an isolated lesion and he completed stereotactic radiation at [facility] in [place] in December 2020.    Plan: We will schedule him for CT chest imaging  as it has been 6 months.  His port was flushed today.  We will plan to see him back in 6 months with CBC, CMP, CEA, and CT thorax for reevaluation.  The patient understands the plans discussed today and is in agreement with them.  He knows to contact our office if he develops concerns prior to his next appointment.   I provided 25 minutes of face-to-face time during this this encounter and > 50% was spent counseling as documented under my assessment and plan.    Jeremy Kaplan, MD [facility] [address] [place] [address] Dept: [phone] Dept Fax: [phone]   I, Rita Ohara,  am acting as scribe for Jeremy Kaplan, MD  I have reviewed this report as typed by the medical scribe, and it is complete and accurate.  Hermina Barters

## 2020-06-03 ENCOUNTER — Encounter: Payer: Self-pay | Admitting: Oncology

## 2020-06-03 ENCOUNTER — Inpatient Hospital Stay (INDEPENDENT_AMBULATORY_CARE_PROVIDER_SITE_OTHER): Payer: Medicare Other | Admitting: Oncology

## 2020-06-03 ENCOUNTER — Other Ambulatory Visit: Payer: Self-pay | Admitting: Oncology

## 2020-06-03 ENCOUNTER — Other Ambulatory Visit: Payer: Self-pay | Admitting: Hematology and Oncology

## 2020-06-03 ENCOUNTER — Telehealth: Payer: Self-pay | Admitting: Oncology

## 2020-06-03 ENCOUNTER — Inpatient Hospital Stay: Payer: Medicare Other | Attending: Oncology

## 2020-06-03 VITALS — BP 130/70 | HR 75 | Temp 97.9°F | Resp 18 | Ht 68.0 in | Wt 265.9 lb

## 2020-06-03 DIAGNOSIS — Z452 Encounter for adjustment and management of vascular access device: Secondary | ICD-10-CM | POA: Insufficient documentation

## 2020-06-03 DIAGNOSIS — C3411 Malignant neoplasm of upper lobe, right bronchus or lung: Secondary | ICD-10-CM

## 2020-06-03 LAB — BASIC METABOLIC PANEL
BUN: 18 (ref 4–21)
CO2: 27 — AB (ref 13–22)
Chloride: 104 (ref 99–108)
Creatinine: 0.8 (ref 0.6–1.3)
Glucose: 110
Potassium: 4.2 (ref 3.4–5.3)
Sodium: 137 (ref 137–147)

## 2020-06-03 LAB — COMPREHENSIVE METABOLIC PANEL
Albumin: 4.6 (ref 3.5–5.0)
Calcium: 9.3 (ref 8.7–10.7)

## 2020-06-03 LAB — HEPATIC FUNCTION PANEL
ALT: 27 (ref 10–40)
AST: 27 (ref 14–40)
Alkaline Phosphatase: 70 (ref 25–125)
Bilirubin, Total: 0.6

## 2020-06-03 LAB — CBC AND DIFFERENTIAL
HCT: 44 (ref 41–53)
Hemoglobin: 14.7 (ref 13.5–17.5)
Neutrophils Absolute: 6.43
Platelets: 325 (ref 150–399)
WBC: 9.6

## 2020-06-03 LAB — CBC: RBC: 4.82 (ref 3.87–5.11)

## 2020-06-03 MED ORDER — SODIUM CHLORIDE 0.9% FLUSH
10.0000 mL | INTRAVENOUS | Status: DC | PRN
Start: 2020-06-03 — End: 2020-06-04
  Administered 2020-06-03: 10 mL
  Filled 2020-06-03: qty 10

## 2020-06-03 MED ORDER — HEPARIN SOD (PORK) LOCK FLUSH 100 UNIT/ML IV SOLN
500.0000 [IU] | Freq: Once | INTRAVENOUS | Status: AC | PRN
  Administered 2020-06-03: 500 [IU]
  Filled 2020-06-03: qty 5

## 2020-06-03 NOTE — Telephone Encounter (Signed)
Per 3/14 los next appt/ct chest scheduled and given to patient

## 2020-06-04 LAB — CEA: CEA: 1.6 ng/mL (ref 0.0–4.7)

## 2020-06-05 ENCOUNTER — Encounter: Payer: Self-pay | Admitting: Oncology

## 2020-11-04 ENCOUNTER — Telehealth: Payer: Self-pay | Admitting: Oncology

## 2020-11-04 NOTE — Telephone Encounter (Signed)
Patient's sister called to verify he had Follow Up Appt after 9/12 Labs, CT Scan.  Patient's Follow Up is scheduled for 9/14 at 10:00 am w/Dr Hinton Rao

## 2020-11-27 NOTE — Progress Notes (Signed)
[facility]  [address] [place],  [place]  [id number] [phone]  Clinic Day:  12/04/2020  Referring physician: Algis Greenhouse, MD  This document serves as a record of services personally performed by Hosie Poisson, MD. It was created on their behalf by [name],[name], a trained medical scribe. The creation of this record is based on the scribe's personal observations and the provider's statements to them.  CHIEF COMPLAINT:  CC: History of multiple malignancies, most recently a stage I non small cell lung cancer in the right upper lobe  Current Treatment:  Surveillance  HISTORY OF PRESENT ILLNESS:  Jeremy Logan. is a 60 y.o. male with a history of stage IIIA non-small cell lung cancer diagnosed in August of 2010 when he presented with a large Pancoast tumor measuring over 8 cm in diameter with destruction of T1 and T2 vertebral bodies.  He received concurrent chemotherapy and radiation with weekly carboplatin and paclitaxel and completed 8 cycles of the chemotherapy.  He has never had any evidence of recurrence, but continues to have severe pain of the right shoulder and neck, which is felt to be from long-term neurologic damage.  He was incidentally found to have a stage I renal cell carcinoma which was treated with a partial left nephrectomy in January 2012 at [facility], and he has not had any recurrence of that either.  However, he has chronic pain of pain in the left flank where his abdomen bulges from his surgery.  He also had an umbilical hernia which became incarcerated and required surgery in 2012. He had opioid dependence, but was tapered off his narcotics last year.  He is not a candidate for methadone because of his other medications.  He also has a diagnosis of depression and bipolar disease.  CT chest in March 2016 revealed no evidence of malignancy, but he had extensive damage to the upper thoracic spine from his prior  malignancy.  He was extremely short of breath when he saw Dr. Hinton Rao in November 2016 and ended up hospitalized for acute respiratory failure for 7 weeks through January 2017.  He ended up with a temporary tracheostomy.  He has insulin dependent diabetic, likely from frequent steroids.  In August 2017, he had severe swelling of the left lower extremity and was found to have extensive acute deep venous thrombosis throughout most of the leg.  He was later seen in the emergency room due to dyspnea and CT a chest revealed pulmonary emboli with evidence of right heart strain.  He was hospitalized on intravenous heparin, he was switched to Lovenox upon discharge and later switched to Xarelto.  Repeat CTA chest in February 2018 revealed resolution of the pulmonary embolism.  As he had been on anticoagulation for 6 months, Xarelto was discontinued at that time.  He presented to the emergency room with dyspnea and chest pain again in November 2018. CTA chest did not reveal any evidence of pulmonary embolism.  There was a new 7 mm right upper lobe nodule.  Repeat CT imaging in March 2019 revealed new mild mediastinal and bilateral hilar adenopathy measuring up to 1.4 cm in diameter.  An AP window lymph node had increased from 8 mm to 13 mm.  There was enlargement of spiculated nodule in the anterior right upper lobe measuring 1.5 cm, compared to 0.9 cm previously.  There were stable post radiation changes in the right upper paramediastinal area.  There were radiation changes of  the right posterior 2nd rib and T2 vertebral body.  The CEA was up to 6.2.  He underwent PET scan for further evaluation.  This did not reveal hypermetabolic activity in the right upper lobe measuring 14 mm.  There was a focus of consolidation in the right upper lobe suggestive of radiation changes, also without hypermetabolic activity.  No hypermetabolic activity was seen in the mediastinal lymph nodes.  When he was seen in May of 2019, the CEA had  gone back down to 3.2.  CT chest in July revealed a slight decrease in the right upper lobe pulmonary nodule, from 1.5 cm to 1.3 cm. Repeat CT chest in October revealed further decrease of the right upper lobe nodule, now measuring 1.1 cm, as well as a 9 mm subcarinal node, which is not pathologically enlarged.  He quit smoking about 2 years ago.  Unfortunately, he does vape.  His CT scan from June 16th 2020 revealed that the anterior right lobe pulmonary nodule had progressed in the interva.  CT scan from October 16th 2020 revealed an interval increase in size of a spiculated nodule of the anterior right upper lobe measuring 1.8 x 0.9 cm, previously 0.9 x 0.7 cm, highly concerning for recurrent or metachronous primary lung malignancy.  He then underwent a PET scan which showed the 1.8 cm spiculated right upper lobe nodule, suspicious for recurrent or metachronous primary bronchogenic neoplasm, less likely metastasis.  The SUV was measured to be 6.1.  His CEA continues to be within the normal range, the last being 1.9.  He underwent a CT guided biopsy on the isolated right lung nodule on November 10th.  This is consistent with a non-small cell type, favor squamous cell.  This is likely a second primary lung cancer, but appears to be a stage I.  The original pathology from 10 years ago was read as large cell lung carcinoma.  Since this is an isolated nodule, I would not recommend chemotherapy and he is not a good candidate for surgical resection.  He was treated with stereotactic radiation at [facility].  PFT's are consistent with asthma among other diagnoses.  He completed stereotactic radiation at [facility] in [place] in December 2020.  CT imaging from March 2021 revealed slight interval decrease in the right upper lobe nodule now measuring 1.8 x 1.0 cm, previously 2.1 x 1.1 cm.  CT chest from August 2021 revealed increased soft tissue density at the site of anterior right upper lobe treated pulmonary nodule,  favored to be secondary to evolving radiation change.  CT imaging from February 2022 were stable.  INTERVAL HISTORY:  Jeremy Logan is here for routine follow up and states that he is now being seen at the pain clinic. They have placed him on morphine 30 mg BID for his chronic left flank and right shoulder pain. He rates his pain as a 7/10 today. CT imaging from September 12th revealed unchanged post treatment/post radiation fibrosis of the paramedian suprahilar right upper lobe. No evidence of recurrent or metastatic malignancy in the chest. Interval resolution of previously seen scattered small ground-glass opacities of the right lower lobe, consistent with resolved infection or inflammation. Unchanged sclerosis and erosion of the posterior right second rib and adjacent T2 vertebral body, stable over multiple prior examinations and likely sequelae of local radiation. White count has increased from 9.6 to 11.6, hemoglobin has decreased from 14.7 to 13.6, and platelets are normal. Chemistries are unremarkable. CEA is normal at 1.8.  His  appetite  is good, and he has gained 3 and 1/2 pounds since his last visit.  He denies fever, chills or other signs of infection.  He denies nausea, vomiting, bowel issues, or abdominal pain.  He denies sore throat, cough, dyspnea, or chest pain.  REVIEW OF SYSTEMS:  Review of Systems  Constitutional: Negative.  Negative for appetite change, chills, fatigue, fever and unexpected weight change.  HENT:  Negative.    Eyes: Negative.   Respiratory: Negative.  Negative for chest tightness, cough, hemoptysis, shortness of breath and wheezing.   Cardiovascular: Negative.  Negative for chest pain, leg swelling and palpitations.  Gastrointestinal: Negative.  Negative for abdominal distention, abdominal pain, blood in stool, constipation, diarrhea, nausea and vomiting.  Endocrine: Negative.   Genitourinary:  Negative for difficulty urinating, dysuria, frequency and hematuria.    Musculoskeletal:  Positive for flank pain (left, chronic). Negative for arthralgias, back pain, gait problem and myalgias.       Right shoulder pain, chronic  Skin: Negative.   Neurological: Negative.  Negative for dizziness, extremity weakness, gait problem, headaches, light-headedness, numbness, seizures and speech difficulty.  Hematological: Negative.   Psychiatric/Behavioral: Negative.  Negative for depression and sleep disturbance. The patient is not nervous/anxious.   All other systems reviewed and are negative.   VITALS:  Blood pressure 138/70, pulse 77, temperature 97.6 F (36.4 C), temperature source Oral, resp. rate 18, height 5\' 8"  (1.727 m), weight 268 lb 9.6 oz (121.8 kg), SpO2 93 %.  Wt Readings from Last 3 Encounters:  12/04/20 268 lb 9.6 oz (121.8 kg)  06/03/20 265 lb 14.4 oz (120.6 kg)  02/21/19 275 lb (124.7 kg)    Body mass index is 40.84 kg/m.  Performance status (ECOG): 1 - Symptomatic but completely ambulatory  PHYSICAL EXAM:  Physical Exam Constitutional:      General: He is not in acute distress.    Appearance: Normal appearance. He is normal weight.  HENT:     Head: Normocephalic and atraumatic.  Eyes:     General: No scleral icterus.    Extraocular Movements: Extraocular movements intact.     Conjunctiva/sclera: Conjunctivae normal.     Pupils: Pupils are equal, round, and reactive to light.  Cardiovascular:     Rate and Rhythm: Normal rate and regular rhythm.     Pulses: Normal pulses.     Heart sounds: Normal heart sounds. No murmur heard.   No friction rub. No gallop.  Pulmonary:     Effort: Pulmonary effort is normal. No respiratory distress.     Breath sounds: Normal breath sounds.  Abdominal:     General: Bowel sounds are normal. There is no distension.     Palpations: Abdomen is soft. There is no hepatomegaly, splenomegaly or mass.     Tenderness: There is no abdominal tenderness.  Musculoskeletal:        General: Normal range of  motion.     Cervical back: Normal range of motion and neck supple.     Right lower leg: No edema.     Left lower leg: No edema.  Lymphadenopathy:     Cervical: No cervical adenopathy.  Skin:    General: Skin is warm and dry.  Neurological:     General: No focal deficit present.     Mental Status: He is alert and oriented to person, place, and time. Mental status is at baseline.  Psychiatric:        Mood and Affect: Mood normal.  Behavior: Behavior normal.        Thought Content: Thought content normal.        Judgment: Judgment normal.    LABS:   CBC Latest Ref Rng & Units 12/02/2020 06/03/2020 11/02/2008  WBC - 11.6 9.6 18.0(H)  Hemoglobin 13.5 - 17.5 13.6 14.7 13.7  Hematocrit 41 - 53 40(A) 44 41.6  Platelets 150 - 399 322 325 453(H)   CMP Latest Ref Rng & Units 12/02/2020 06/03/2020 11/02/2008  Glucose 70 - 99 mg/dL - - 124(H)  BUN 4 - 21 17 18 21   Creatinine 0.6 - 1.3 1.1 0.8 0.82  Sodium 137 - 147 137 137 135  Potassium 3.4 - 5.3 4.2 4.2 4.4  Chloride 99 - 108 99 104 101  CO2 13 - 22 27(A) 27(A) 26  Calcium 8.7 - 10.7 9.4 9.3 8.9  Total Protein 6.0 - 8.3 g/dL - - -  Total Bilirubin 0.3 - 1.2 mg/dL - - -  Alkaline Phos 25 - 125 75 70 -  AST 14 - 40 25 27 -  ALT 10 - 40 21 27 -     Lab Results  Component Value Date   CEA1 1.8 12/02/2020   /  CEA  Date Value Ref Range Status  12/02/2020 1.8  Final     STUDIES:  No results found.   EXAM: 12/02/2020 CT CHEST WITH CONTRAST  TECHNIQUE: Multidetector CT imaging of the chest was performed during intravenous contrast administration.  CONTRAST:  60 mL Isovue 370 iodinated contrast IV  COMPARISON:  06/06/2020  FINDINGS: Cardiovascular: Left chest port catheter. Scattered aortic atherosclerosis. Normal heart size. No pericardial effusion.  Mediastinum/Nodes: No enlarged mediastinal, hilar, or axillary lymph nodes. Thyroid gland, trachea, and esophagus demonstrate no significant  findings.  Lungs/Pleura: Unchanged post treatment/post radiation fibrosis of the paramedian suprahilar right upper lobe (series 4, image 23). Mild centrilobular emphysema. Interval resolution of previously seen scattered small ground-glass opacities of the right lower lobe. No pleural effusion or pneumothorax.  Upper Abdomen: No acute abnormality. Partial superior pole left nephrectomy (series 2, image 124) unchanged small, partially calcified focus of fat necrosis posterior to the spleen and underlying the left hemidiaphragm (series 2, image 110).  Musculoskeletal: No chest wall mass. Unchanged sclerosis and erosion of the posterior right second rib and adjacent T2 vertebral body (series 4, image 14).  IMPRESSION: 1. Unchanged post treatment/post radiation fibrosis of the paramedian suprahilar right upper lobe. No evidence of recurrent or metastatic malignancy in the chest. 2. Interval resolution of previously seen scattered small ground-glass opacities of the right lower lobe, consistent with resolved infection or inflammation. 3. Unchanged sclerosis and erosion of the posterior right second rib and adjacent T2 vertebral body, stable over multiple prior examinations and likely sequelae of local radiation. 4. Emphysema. 5. Partial superior pole left nephrectomy.  Aortic Atherosclerosis (ICD10-I70.0) and Emphysema (ICD10-J43.9).  Allergies: No Known Allergies  Current Medications: Current Outpatient Medications  Medication Sig Dispense Refill   amitriptyline (ELAVIL) 50 MG tablet Take 50 mg by mouth at bedtime.     aspirin EC 81 MG tablet Take 81 mg by mouth daily.     atorvastatin (LIPITOR) 80 MG tablet Take 80 mg by mouth daily at 6 PM.      carvedilol (COREG) 3.125 MG tablet Take 3.125 mg by mouth 2 (two) times daily with a meal.      Dulaglutide (TRULICITY) 1.5 ZO/1.0RU SOPN Inject 1.5 mg into the skin every 7 (seven) days.  erythromycin ophthalmic ointment  SMARTSIG:In Eye(s)     ezetimibe (ZETIA) 10 MG tablet TAKE 1 TABLET BY MOUTH DAILY FOR CHOLESTEROL     FLUoxetine (PROZAC) 20 MG capsule Take 40 mg by mouth 2 (two) times daily.     furosemide (LASIX) 20 MG tablet Take 20 mg by mouth.     latanoprost (XALATAN) 0.005 % ophthalmic solution 1 drop at bedtime.     morphine (MS CONTIN) 30 MG 12 hr tablet Take 30 mg by mouth 2 (two) times daily as needed.     OLANZapine (ZYPREXA) 10 MG tablet Take 10 mg by mouth at bedtime.     spironolactone (ALDACTONE) 25 MG tablet Take 25 mg by mouth daily.     No current facility-administered medications for this visit.     ASSESSMENT & PLAN:   Assessment:   1. 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     2. Stage I renal cell carcinoma January 2012 treated with a partial left nephrectomy.  He has not had evidence of recurrence.  3. 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 CEA was normal.  This appears to be more consistent with a squamous cell type.  It was confirmed to be an isolated lesion and he completed stereotactic radiation at [facility] in [place] in December 2020.    4. 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  5. Severe COPD.  He did have an episode of respiratory failure in November of 2016 requiring a temp tracheostomy.  6. Deep venous thrombosis in August of 2017 with associated pulmonary emboli.  7. Depression and bipolar disease.  8. Severe insulin-dependent diabetes mellitus, well controlled at this time.    9. History of substance abuse.  10. Surgery for incarcerated umbilical hernia in [id number].    Plan: CT imaging remains stable nearly 2 years after treatment of his 2nd non-small cell lung cancer. His port was flushed today.  We will plan to see  him back in 6 months with CBC, CMP, CEA, and CT chest for reevaluation.  The patient understands the plans discussed today and is in agreement with them.  He knows to contact our office if he develops concerns prior to his next appointment.   I provided 25 minutes of face-to-face time during this this encounter and > 50% was spent counseling as documented under my assessment and plan.    Derwood Kaplan, MD [facility] AT [facility] [address] [place] [address] Dept: [phone] Dept Fax: [phone]   I, Rita Ohara, am acting as scribe for Derwood Kaplan, MD  I have reviewed this report as typed by the medical scribe, and it is complete and accurate.  Hermina Barters

## 2020-11-28 ENCOUNTER — Encounter: Payer: Self-pay | Admitting: Oncology

## 2020-12-02 LAB — CBC AND DIFFERENTIAL
HCT: 40 — AB (ref 41–53)
Hemoglobin: 13.6 (ref 13.5–17.5)
Neutrophils Absolute: 8.7
Platelets: 322 (ref 150–399)
WBC: 11.6

## 2020-12-02 LAB — HEPATIC FUNCTION PANEL
ALT: 21 (ref 10–40)
AST: 25 (ref 14–40)
Alkaline Phosphatase: 75 (ref 25–125)
Bilirubin, Total: 0.6

## 2020-12-02 LAB — BASIC METABOLIC PANEL
BUN: 17 (ref 4–21)
CO2: 27 — AB (ref 13–22)
Chloride: 99 (ref 99–108)
Creatinine: 1.1 (ref 0.6–1.3)
Glucose: 109
Potassium: 4.2 (ref 3.4–5.3)
Sodium: 137 (ref 137–147)

## 2020-12-02 LAB — COMPREHENSIVE METABOLIC PANEL
Albumin: 4.5 (ref 3.5–5.0)
Calcium: 9.4 (ref 8.7–10.7)

## 2020-12-02 LAB — CEA: CEA: 1.8

## 2020-12-02 LAB — CBC: RBC: 4.41 (ref 3.87–5.11)

## 2020-12-04 ENCOUNTER — Other Ambulatory Visit: Payer: Self-pay | Admitting: Oncology

## 2020-12-04 ENCOUNTER — Telehealth: Payer: Self-pay | Admitting: Oncology

## 2020-12-04 ENCOUNTER — Encounter: Payer: Self-pay | Admitting: Oncology

## 2020-12-04 ENCOUNTER — Inpatient Hospital Stay: Payer: Medicare Other | Attending: Oncology | Admitting: Oncology

## 2020-12-04 VITALS — BP 138/70 | HR 77 | Temp 97.6°F | Resp 18 | Ht 68.0 in | Wt 268.6 lb

## 2020-12-04 DIAGNOSIS — Z85528 Personal history of other malignant neoplasm of kidney: Secondary | ICD-10-CM | POA: Diagnosis present

## 2020-12-04 DIAGNOSIS — C3411 Malignant neoplasm of upper lobe, right bronchus or lung: Secondary | ICD-10-CM

## 2020-12-04 DIAGNOSIS — E119 Type 2 diabetes mellitus without complications: Secondary | ICD-10-CM | POA: Diagnosis not present

## 2020-12-04 DIAGNOSIS — Z794 Long term (current) use of insulin: Secondary | ICD-10-CM | POA: Insufficient documentation

## 2020-12-04 DIAGNOSIS — Z923 Personal history of irradiation: Secondary | ICD-10-CM | POA: Diagnosis not present

## 2020-12-04 DIAGNOSIS — Z85118 Personal history of other malignant neoplasm of bronchus and lung: Secondary | ICD-10-CM | POA: Diagnosis present

## 2020-12-04 DIAGNOSIS — Z87891 Personal history of nicotine dependence: Secondary | ICD-10-CM | POA: Insufficient documentation

## 2020-12-04 DIAGNOSIS — Z452 Encounter for adjustment and management of vascular access device: Secondary | ICD-10-CM | POA: Diagnosis not present

## 2020-12-04 DIAGNOSIS — Z79899 Other long term (current) drug therapy: Secondary | ICD-10-CM | POA: Diagnosis not present

## 2020-12-04 DIAGNOSIS — Z86718 Personal history of other venous thrombosis and embolism: Secondary | ICD-10-CM | POA: Insufficient documentation

## 2020-12-04 DIAGNOSIS — Z7982 Long term (current) use of aspirin: Secondary | ICD-10-CM | POA: Insufficient documentation

## 2020-12-04 DIAGNOSIS — Z905 Acquired absence of kidney: Secondary | ICD-10-CM | POA: Diagnosis not present

## 2020-12-04 DIAGNOSIS — Z9221 Personal history of antineoplastic chemotherapy: Secondary | ICD-10-CM | POA: Diagnosis not present

## 2020-12-04 MED ORDER — SODIUM CHLORIDE 0.9% FLUSH
10.0000 mL | INTRAVENOUS | Status: DC | PRN
  Administered 2020-12-04: 10 mL

## 2020-12-04 MED ORDER — HEPARIN SOD (PORK) LOCK FLUSH 100 UNIT/ML IV SOLN
500.0000 [IU] | Freq: Once | INTRAVENOUS | Status: AC | PRN
  Administered 2020-12-04: 500 [IU]

## 2020-12-04 NOTE — Telephone Encounter (Signed)
Per 9/14 LOS NEXT APPT SCHEDULED AND GIVEN TO PATIENT

## 2020-12-07 ENCOUNTER — Encounter: Payer: Self-pay | Admitting: Hematology and Oncology

## 2021-02-18 DIAGNOSIS — M79651 Pain in right thigh: Secondary | ICD-10-CM | POA: Insufficient documentation

## 2021-04-01 ENCOUNTER — Telehealth: Payer: Self-pay

## 2021-04-01 LAB — CBC AND DIFFERENTIAL
HCT: 33 — AB (ref 41–53)
Hemoglobin: 11.5 — AB (ref 13.5–17.5)
Neutrophils Absolute: 4.79
Platelets: 259 (ref 150–399)
WBC: 7.6

## 2021-04-01 LAB — BASIC METABOLIC PANEL
BUN: 13 (ref 4–21)
CO2: 28 — AB (ref 13–22)
Chloride: 102 (ref 99–108)
Creatinine: 0.9 (ref 0.6–1.3)
Glucose: 167
Potassium: 4.2 (ref 3.4–5.3)
Sodium: 138 (ref 137–147)

## 2021-04-01 LAB — CBC: RBC: 3.78 — AB (ref 3.87–5.11)

## 2021-04-01 LAB — COMPREHENSIVE METABOLIC PANEL: Calcium: 9.3 (ref 8.7–10.7)

## 2021-04-01 NOTE — Telephone Encounter (Signed)
Spoke with Dr. Hinton Rao and pt does not need port replaced at this time.  I relayed this information to Manuela Schwartz.

## 2021-04-02 ENCOUNTER — Other Ambulatory Visit: Payer: Self-pay

## 2021-04-03 ENCOUNTER — Other Ambulatory Visit: Payer: Self-pay

## 2021-04-03 ENCOUNTER — Ambulatory Visit: Payer: Medicare Other | Admitting: Oncology

## 2021-04-03 ENCOUNTER — Inpatient Hospital Stay: Payer: Medicare Other | Attending: Hematology and Oncology | Admitting: Hematology and Oncology

## 2021-04-03 ENCOUNTER — Encounter: Payer: Self-pay | Admitting: Hematology and Oncology

## 2021-04-03 VITALS — BP 155/87 | HR 84 | Temp 98.3°F | Resp 20 | Ht 68.0 in | Wt 282.4 lb

## 2021-04-03 DIAGNOSIS — D539 Nutritional anemia, unspecified: Secondary | ICD-10-CM

## 2021-04-03 DIAGNOSIS — C3411 Malignant neoplasm of upper lobe, right bronchus or lung: Secondary | ICD-10-CM | POA: Diagnosis not present

## 2021-04-03 DIAGNOSIS — Z452 Encounter for adjustment and management of vascular access device: Secondary | ICD-10-CM | POA: Diagnosis not present

## 2021-04-03 HISTORY — DX: Nutritional anemia, unspecified: D53.9

## 2021-04-03 LAB — CBC: MCV: 88 (ref 80–94)

## 2021-04-03 NOTE — Assessment & Plan Note (Signed)
History of stage IIIA lung cancer with a large Pancoast tumor diagnosed in August 2010. This was treated with chemoradiation and he has never had evidence of recurrence.  He remains on observation.  He is scheduled to see [place] in March and will keep that appointment.

## 2021-04-03 NOTE — Assessment & Plan Note (Addendum)
New onset anemia, likely nutritional deficiency.  B12 and folate were normal.  Iron studies were equivocal with a percent saturation of 12, iron 44 u/dL, TIBC 365 ug/dL and ferritin 107 ng/mL.  I will add a soluble transferrin level to further evaluate for iron deficiency.

## 2021-04-03 NOTE — Assessment & Plan Note (Signed)
The patient was seen in the [facility] emergency room on January 10th due to pain and redness at his Port-A-Cath site and was referred for evaluation and management.  He has an appointment with his surgeon sometime soon.  His symptoms have resolved and the Port-A-Cath site does not appear infected, so he would like to keep the Port-A-Cath.  We will go ahead and flush this today and as long as it is functioning well, he can cancel the appointment with his surgeon.  He knows to contact our office if he has recurrent symptoms.Marland Kitchen

## 2021-04-03 NOTE — Progress Notes (Addendum)
Jeremy Logan  [place],  [place]  [address] [phone]    Addendum: A soluble transferrin could not be added to labs drawn in the emergency room.  We will plan to repeat labs at his next visit in March.   Clinic Day:  04/03/2021  Referring physician: Algis Greenhouse, MD  ASSESSMENT & PLAN:   Assessment & Plan: Encounter for care related to Port-a-Cath The patient was seen in the [facility] emergency room on January 10th due to pain and redness at his Port-A-Cath site and was referred for evaluation and management.  He has an appointment with his surgeon sometime soon.  His symptoms have resolved and the Port-A-Cath site does not appear infected, so he would like to keep the Port-A-Cath.  We will go ahead and flush this today and as long as it is functioning well, he can cancel the appointment with his surgeon.  He knows to contact our office if he has recurrent symptoms..  Malignant neoplasm of upper lobe of right lung ([place]) History of stage IIIA lung cancer with a large Pancoast tumor diagnosed in August 2010. This was treated with chemoradiation and he has never had evidence of recurrence.  He remains on observation.  He is scheduled to see [place] in March and will keep that appointment.  Deficiency anemia New onset anemia, likely nutritional deficiency.  B12 and folate were normal.  Iron studies were equivocal with a percent saturation of 12, iron 44 u/dL, TIBC 365 ug/dL and ferritin 107 ng/mL.  I will add a soluble transferrin level to further evaluate for iron deficiency.   The patient understands the plans discussed today and is in agreement with them.  He knows to contact our office if he develops concerns prior to his next appointment.      Marvia Pickles, PA-C  [facility] AT [facility] [address] [place] [address] Dept: [phone] Dept Fax: [phone]    Orders Placed This Encounter  Procedures   CBC and differential    This external order was created through the Results Console.   CBC    This external order was created through the Results Console.   Basic metabolic panel    This external order was created through the Results Console.   Comprehensive metabolic panel    This external order was created through the Results Console.   CBC    This order was created through External Result Entry   Iron and Iron Binding Capacity ([facility] only)    Standing Status:   Future    Standing Expiration Date:   04/03/2022   Ferritin    Standing Status:   Future    Standing Expiration Date:   04/03/2022   Folate    Standing Status:   Future    Standing Expiration Date:   04/03/2022   Vitamin B12    Standing Status:   Future    Standing Expiration Date:   04/03/2022      CHIEF COMPLAINT:  CC: Pain at Port-A-Cath site  Current Treatment:  Observation  HISTORY OF PRESENT ILLNESS:   Oncology History  Malignant neoplasm of upper lobe of right lung ([place])  10/30/2008 Cancer Staging   Staging form: Lung, AJCC 7th Edition - Clinical stage from 10/30/2008: Stage IIIA (T4, N0, M0) - Signed by Derwood Kaplan, MD on 05/10/2020 Staged by: Managing physician Diagnostic confirmation: Positive histology Specimen type: Core Needle Biopsy  Histopathologic type: Carcinoma, NOS Stage prefix: Initial diagnosis Laterality: Right Lymph-vascular invasion (LVI): LVI not present (absent)/not identified Prognostic indicators: Treated with chemoradiation for pancoast tumor Stage used in treatment planning: Yes National guidelines used in treatment planning: Yes Type of national guideline used in treatment planning: NCCN    06/12/2015 Initial Diagnosis   Malignant neoplasm of upper lobe of right lung ([place])   Renal cell carcinoma ([place])  03/2010 Initial Diagnosis   Renal cell carcinoma ([place])   04/03/2010 Cancer Staging   Staging form: Kidney, AJCC 7th  Edition - Clinical stage from 04/03/2010: Stage I (T1a, N0, M0) - Signed by Derwood Kaplan, MD on 05/10/2020 Staged by: Managing physician Diagnostic confirmation: Positive histology Specimen type: Excision Histopathologic type: Clear cell adenocarcinoma, NOS Stage prefix: Initial diagnosis Laterality: Left Tumor size (mm): 22 Histologic grade (G): G2 Lymph-vascular invasion (LVI): LVI not present (absent)/not identified Residual tumor (R): R0 - None Invasion beyond capsule into fat or perisinus tissues: Absent Adrenal extension: Negative Sarcomatoid features: Absent Tumor necrosis: Absent Presence of extranodal extension: Absent Stage used in treatment planning: Yes National guidelines used in treatment planning: Yes Type of national guideline used in treatment planning: NCCN    Cancer of lung, upper lobe ([place])  01/31/2019 Initial Diagnosis   Cancer of lung, upper lobe ([place])   02/10/2019 Cancer Staging   Staging form: Lung, AJCC 8th Edition - Clinical stage from 02/10/2019: Stage IA2 (cT1b, cN0, cM0) - Signed by Derwood Kaplan, MD on 05/10/2020 Histopathologic type: Squamous cell carcinoma, NOS Stage prefix: Initial diagnosis Laterality: Right Tumor size (mm): 18 Lymph-vascular invasion (LVI): LVI not present (absent)/not identified Diagnostic confirmation: Positive histology Specimen type: Core Needle Biopsy Staged by: Managing physician Stage used in treatment planning: Yes National guidelines used in treatment planning: Yes Type of national guideline used in treatment planning: NCCN Staging comments: Stereotactic radiation       INTERVAL HISTORY:  Jeremy Logan is here for follow up after being seen at [facility] ER for pain and redness at his Port-A-Cath site.   Chest x-ray did not reveal any acute cardiopulmonary disease.  There was stable right suprahilar post treatment scarring.  He states the pain and redness have resolved.  He feels he may have slept  on it wrong.  He has an appointment with his surgeon coming up soon, but he does not want to have his port removed unless he has to.  He states he will cancel that appointment.  He denies cough, shortness of breath or chest pain.   He denies fevers or chills.  He continues to follow with pain management.  He states his chronic pain is well controlled with Horizant twice daily and hydromorphone 4 mg every 6 hours scheduled.  He is no longer on MS Contin. His appetite is good. His weight has increased 14 pounds over last 4 months .  His sister accompanies him and wants him to have the port removed.  REVIEW OF SYSTEMS:  Review of Systems  Constitutional:  Negative for appetite change, chills, fatigue, fever and unexpected weight change.  HENT:   Negative for lump/mass, mouth sores and sore throat.   Respiratory:  Negative for cough and shortness of breath.   Cardiovascular:  Negative for chest pain and leg swelling.  Gastrointestinal:  Negative for abdominal pain, constipation, diarrhea, nausea and vomiting.  Genitourinary:  Negative for difficulty urinating, dysuria, frequency and hematuria.   Musculoskeletal:  Negative for arthralgias, back pain and myalgias.  Skin:  Negative for  itching, rash and wound.  Neurological:  Negative for dizziness, extremity weakness, headaches, light-headedness and numbness.  Hematological:  Negative for adenopathy.  Psychiatric/Behavioral:  Negative for depression and sleep disturbance. The patient is not nervous/anxious.     VITALS:  Blood pressure (!) 155/87, pulse 84, temperature 98.3 F (36.8 C), temperature source Oral, resp. rate 20, height 5\' 8"  (1.727 m), weight 282 lb 6.4 oz (128.1 kg), SpO2 95 %.  Wt Readings from Last 3 Encounters:  04/03/21 282 lb 6.4 oz (128.1 kg)  12/04/20 268 lb 9.6 oz (121.8 kg)  06/03/20 265 lb 14.4 oz (120.6 kg)    Body mass index is 42.94 kg/m.  Performance status (ECOG): 0 - Asymptomatic  PHYSICAL EXAM:  Physical  Exam Vitals and nursing note reviewed.  Constitutional:      General: He is not in acute distress.    Appearance: Normal appearance. He is normal weight.  HENT:     Head: Normocephalic and atraumatic.     Mouth/Throat:     Mouth: Mucous membranes are moist.     Pharynx: Oropharynx is clear. No oropharyngeal exudate or posterior oropharyngeal erythema.  Eyes:     General: No scleral icterus.    Extraocular Movements: Extraocular movements intact.     Conjunctiva/sclera: Conjunctivae normal.     Pupils: Pupils are equal, round, and reactive to light.  Cardiovascular:     Rate and Rhythm: Normal rate and regular rhythm.     Heart sounds: Normal heart sounds. No murmur heard.   No friction rub. No gallop.  Pulmonary:     Effort: Pulmonary effort is normal.     Breath sounds: Normal breath sounds. No wheezing, rhonchi or rales.  Chest:     Comments: Port-A-Cath site looks good with no swelling, tenderness or significant erythema Abdominal:     General: Bowel sounds are normal. There is no distension.     Palpations: Abdomen is soft. There is no hepatomegaly, splenomegaly or mass.     Tenderness: There is no abdominal tenderness.  Musculoskeletal:        General: Normal range of motion.     Cervical back: Normal range of motion and neck supple. No tenderness.     Right lower leg: No edema.     Left lower leg: No edema.  Lymphadenopathy:     Cervical: No cervical adenopathy.     Upper Body:     Right upper body: No supraclavicular or axillary adenopathy.     Left upper body: No supraclavicular or axillary adenopathy.     Lower Body: No right inguinal adenopathy. No left inguinal adenopathy.  Skin:    General: Skin is warm and dry.     Coloration: Skin is not jaundiced.     Findings: No rash.  Neurological:     Mental Status: He is alert and oriented to person, place, and time.     Cranial Nerves: No cranial nerve deficit.  Psychiatric:        Mood and Affect: Mood normal.         Behavior: Behavior normal.        Thought Content: Thought content normal.    LABS:   CBC Latest Ref Rng & Units 04/01/2021 12/02/2020 06/03/2020  WBC - 7.6 11.6 9.6  Hemoglobin 13.5 - 17.5 11.5(A) 13.6 14.7  Hematocrit 41 - 53 33(A) 40(A) 44  Platelets 150 - 399 259 322 325   CMP Latest Ref Rng & Units 04/01/2021 12/02/2020 06/03/2020  Glucose  70 - 99 mg/dL - - -  BUN 4 - 21 13 17 18   Creatinine 0.6 - 1.3 0.9 1.1 0.8  Sodium 137 - 147 138 137 137  Potassium 3.4 - 5.3 4.2 4.2 4.2  Chloride 99 - 108 102 99 104  CO2 13 - 22 28(A) 27(A) 27(A)  Calcium 8.7 - 10.7 9.3 9.4 9.3  Total Protein 6.0 - 8.3 g/dL - - -  Total Bilirubin 0.3 - 1.2 mg/dL - - -  Alkaline Phos 25 - 125 - 75 70  AST 14 - 40 - 25 27  ALT 10 - 40 - 21 27     Lab Results  Component Value Date   CEA1 1.8 12/02/2020   /  CEA  Date Value Ref Range Status  12/02/2020 1.8  Final   No results found for: PSA1 No results found for: ZOX096 No results found for: EAV409  No results found for: TOTALPROTELP, ALBUMINELP, A1GS, A2GS, BETS, BETA2SER, GAMS, MSPIKE, SPEI No results found for: TIBC, FERRITIN, IRONPCTSAT No results found for: LDH  STUDIES:  No results found.    Exam(s): [id number] RAD/DG CHEST 2V  CLINICAL DATA: Chest port. Chest wall pain. History of right-sided  lung cancer  EXAM:  CHEST - 2 VIEW  COMPARISON: 10/31/2017, 12/02/2020  FINDINGS:  Left IJ approach Port-A-Cath remains in place with distal tip  terminating at the level of the mid to distal SVC. Stable heart  size. Stable right suprahilar post treatment changes/scarring. Lungs  appear otherwise clear. No pleural effusion or pneumothorax. Chronic  posterior left seventh rib fracture. No acute osseous findings.  IMPRESSION:  1. No active cardiopulmonary disease.  2. Stable right suprahilar post treatment changes/scarring.   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   RIGHT SHOULDER AND NECK AFTER DESTRUCTION OF THE T1/T2 VERTEBRAL BODIES   COPD (chronic obstructive pulmonary disease) ([place])    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/2021   Depression    Diabetes mellitus without complication ([place])    INSULIN DEPENDENT DM   DVT (deep venous thrombosis) ([place]) 10/2015   HAD 6 MONTH TX WITH ANTICOAGULANTS   Pulmonary embolism ([place]) 2017   TREATED WITH XARELTO   Renal cell carcinoma ([place]) 03/2010   PARTIAL LEFT NEPHRECTOMY/[facility]   Substance abuse ([place])    TOBACCO...QUIT SMOKING 2018    Past Surgical History:  Procedure Laterality Date   biopsy of lung     HERNIA REPAIR  [id number]   UMBILICAL INCARCERATED   PARTIAL LEFT NEPHECTOMY  03/2010    History reviewed. No pertinent family history.  Social History:  reports that he quit smoking about 6 years ago. His smoking use included cigarettes. He has never used smokeless tobacco. No history on file for alcohol use and drug use.The patient is accompanied by his sister today.  Allergies: No Known Allergies  Current Medications: Current Outpatient Medications  Medication Sig Dispense Refill   amitriptyline (ELAVIL) 50 MG tablet Take 50 mg by mouth at bedtime.     aspirin EC 81 MG tablet Take 81 mg by mouth daily.     atorvastatin (LIPITOR) 80 MG tablet Take 80 mg by mouth daily at 6 PM.      carvedilol (COREG) 3.125 MG tablet Take 3.125 mg  by mouth 2 (two) times daily with a meal.      diclofenac (VOLTAREN) 50 MG EC tablet Take by mouth.     Dulaglutide (TRULICITY) 1.5 FM/3.8GY SOPN Inject 1.5 mg into the skin every 7 (seven) days.      erythromycin ophthalmic ointment SMARTSIG:In Eye(s) (Patient not taking: Reported on 04/03/2021)     ezetimibe (ZETIA) 10 MG tablet TAKE 1 TABLET BY MOUTH DAILY FOR CHOLESTEROL     FLUoxetine (PROZAC) 20 MG capsule  Take 40 mg by mouth 2 (two) times daily.     furosemide (LASIX) 20 MG tablet Take 20 mg by mouth.     gabapentin (NEURONTIN) 300 MG capsule  (Patient not taking: Reported on 04/03/2021)     HORIZANT 600 MG TBCR Take 1 tablet by mouth 2 (two) times daily. (Patient not taking: Reported on 04/03/2021)     HYDROmorphone (DILAUDID) 4 MG tablet Take 4 mg by mouth every 6 (six) hours as needed.     latanoprost (XALATAN) 0.005 % ophthalmic solution 1 drop at bedtime.     morphine (MS CONTIN) 30 MG 12 hr tablet Take 30 mg by mouth 2 (two) times daily as needed. (Patient not taking: Reported on 04/03/2021)     OLANZapine (ZYPREXA) 10 MG tablet Take 10 mg by mouth at bedtime.     spironolactone (ALDACTONE) 25 MG tablet Take 25 mg by mouth daily.     No current facility-administered medications for this visit.

## 2021-04-04 ENCOUNTER — Encounter: Payer: Self-pay | Admitting: Hematology and Oncology

## 2021-05-27 NOTE — Progress Notes (Incomplete)
Jeremy Logan  [address] [place],  [place]  [address] [phone]  Clinic Day:  06/03/2021  Referring physician: Algis Greenhouse, MD  This document serves as a record of services personally performed by Hosie Poisson, MD. It was created on their behalf by [name],[name], a trained medical scribe. The creation of this record is based on the scribe's personal observations and the provider's statements to them.  ASSESSMENT & PLAN:   Assessment & Plan: 1. 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      2. Stage I renal cell carcinoma January 2012 treated with a partial left nephrectomy.  He has not had evidence of recurrence.   3. 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 CEA was normal.  This appears to be more consistent with a squamous cell type.  It was confirmed to be an isolated lesion and he completed stereotactic radiation at [facility] in [place] in December 2020.     4. 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   5. Severe COPD.  He did have an episode of respiratory failure in November of 2016 requiring a temp tracheostomy.   6. Deep venous thrombosis in August of 2017 with associated pulmonary emboli.   7. Depression and bipolar disease.   8. Severe insulin-dependent diabetes mellitus, well controlled at this time.     9. History of substance abuse.   10. Surgery for incarcerated umbilical hernia in 4401.    11.  New onset anemia, likely from nutritional deficiency. B12 and folate were normal.  Iron studies were equivocal with a percent saturation of 12, iron 44 u/dL, TIBC 365 ug/dL and ferritin 107 ng/mL. Soluble transferrin could not be added to his labs from the emergency room.  CT imaging  reveals ***. We will plan to see him back in 6 months with CBC and CMP for repeat evaluation. The patient understands the plans discussed today and is in agreement with them.  He knows to contact our office if he develops concerns prior to his next appointment.  I provided *** minutes of face-to-face time during this this encounter and > 50% was spent counseling as documented under my assessment and plan.    [facility] [address] [place] [address] Dept: [phone] Dept Fax: [phone]   No orders of the defined types were placed in this encounter.     CHIEF COMPLAINT:  CC: History of multiple malignancies, most recently a stage I non small cell lung cancer in the right upper lobe  Current Treatment:  Observation  HISTORY OF PRESENT ILLNESS:  History of stage IIIA non-small cell lung cancer diagnosed in August of 2010 when he presented with a large Pancoast tumor measuring over 8 cm in diameter with destruction of T1 and T2 vertebral bodies.  He received concurrent chemotherapy and radiation with weekly carboplatin and paclitaxel and completed 8 cycles of the chemotherapy.  He has never had any evidence of recurrence, but continues to have severe pain of the right shoulder and neck, which is felt to be from long-term neurologic damage.  He was incidentally found to have a stage I renal cell carcinoma which was treated with a  partial left nephrectomy in January 2012 at [facility], and he has not had any recurrence of that either.  However, he has chronic pain of pain in the left flank where his abdomen bulges from his surgery.  He also had an umbilical hernia which became incarcerated and required surgery in 2012. He had opioid dependence, but was tapered off his narcotics last year.  He is not a candidate for methadone because of his other medications.  He also has a diagnosis of depression  and bipolar disease.  CT chest in March 2016 revealed no evidence of malignancy, but he had extensive damage to the upper thoracic spine from his prior malignancy.  He was extremely short of breath when he saw Dr. Hinton Rao in November 2016 and ended up hospitalized for acute respiratory failure for 7 weeks through January 2017.  He ended up with a temporary tracheostomy.  He has insulin dependent diabetic, likely from frequent steroids.  In August 2017, he had severe swelling of the left lower extremity and was found to have extensive acute deep venous thrombosis throughout most of the leg.  He was later seen in the emergency room due to dyspnea and CT a chest revealed pulmonary emboli with evidence of right heart strain.  He was hospitalized on intravenous heparin, he was switched to Lovenox upon discharge and later switched to Xarelto.  Repeat CTA chest in February 2018 revealed resolution of the pulmonary embolism.  As he had been on anticoagulation for 6 months, Xarelto was discontinued at that time.  He presented to the emergency room with dyspnea and chest pain again in November 2018. CTA chest did not reveal any evidence of pulmonary embolism.  There was a new 7 mm right upper lobe nodule.   Repeat CT imaging in March 2019 revealed new mild mediastinal and bilateral hilar adenopathy measuring up to 1.4 cm in diameter.  An AP window lymph node had increased from 8 mm to 13 mm.  There was enlargement of spiculated nodule in the anterior right upper lobe measuring 1.5 cm, compared to 0.9 cm previously.  There were stable post radiation changes in the right upper paramediastinal area.  There were radiation changes of the right posterior 2nd rib and T2 vertebral body.  The CEA was up to 6.2.  He underwent PET scan for further evaluation.  This did not reveal hypermetabolic activity in the right upper lobe measuring 14 mm.  There was a focus of consolidation in the right upper lobe suggestive of radiation changes,  also without hypermetabolic activity.  No hypermetabolic activity was seen in the mediastinal lymph nodes.  When he was seen in May of 2019, the CEA had gone back down to 3.2.  CT chest in July revealed a slight decrease in the right upper lobe pulmonary nodule, from 1.5 cm to 1.3 cm. Repeat CT chest in October revealed further decrease of the right upper lobe nodule, now measuring 1.1 cm, as well as a 9 mm subcarinal node, which is not pathologically enlarged.  He quit smoking about 2 years ago.  Unfortunately, he does vape.  His CT scan from June 16th 2020 revealed that the anterior right lobe pulmonary nodule had progressed in the interva.  CT scan from October 16th 2020 revealed an interval increase in size of a spiculated nodule of the anterior right upper lobe measuring 1.8 x 0.9 cm, previously 0.9 x 0.7 cm, highly concerning for recurrent or metachronous primary lung malignancy.  He then underwent a  PET scan which showed the 1.8 cm spiculated right upper lobe nodule, suspicious for recurrent or metachronous primary bronchogenic neoplasm, less likely metastasis.  The SUV was measured to be 6.1.  His CEA continues to be within the normal range, the last being 1.9.  He underwent a CT guided biopsy on the isolated right lung nodule on November 10th.  This is consistent with a non-small cell type, favor squamous cell.  This is likely a second primary lung cancer, but appears to be a stage I.  The original pathology from 10 years ago was read as large cell lung carcinoma.  Since this is an isolated nodule, I would not recommend chemotherapy and he is not a good candidate for surgical resection.  He was treated with stereotactic radiation at [facility].  PFT's are consistent with asthma among other diagnoses.  He completed stereotactic radiation at [facility] in [place] in December 2020.  CT imaging from March 2021 revealed slight interval decrease in the right upper lobe nodule now measuring 1.8 x 1.0 cm,  previously 2.1 x 1.1 cm.  CT chest from August 2021 revealed increased soft tissue density at the site of anterior right upper lobe treated pulmonary nodule, favored to be secondary to evolving radiation change.  CT imaging from February 2022 were stable. CT imaging from September 2022 revealed unchanged post treatment/post radiation fibrosis of the paramedian suprahilar right upper lobe. No evidence of recurrent or metastatic malignancy in the chest. Interval resolution of previously seen scattered small ground-glass opacities of the right lower lobe, consistent with resolved infection or inflammation. Unchanged sclerosis and erosion of the posterior right second rib and adjacent T2 vertebral body, stable over multiple prior examinations and likely sequelae of local radiation.    Oncology History  Malignant neoplasm of upper lobe of right lung ([place])  10/30/2008 Cancer Staging   Staging form: Lung, AJCC 7th Edition - Clinical stage from 10/30/2008: Stage IIIA (T4, N0, M0) - Signed by Derwood Kaplan, MD on 05/10/2020 Staged by: Managing physician Diagnostic confirmation: Positive histology Specimen type: Core Needle Biopsy Histopathologic type: Carcinoma, NOS Stage prefix: Initial diagnosis Laterality: Right Lymph-vascular invasion (LVI): LVI not present (absent)/not identified Prognostic indicators: Treated with chemoradiation for pancoast tumor Stage used in treatment planning: Yes National guidelines used in treatment planning: Yes Type of national guideline used in treatment planning: NCCN   06/12/2015 Initial Diagnosis   Malignant neoplasm of upper lobe of right lung ([place])   Renal cell carcinoma ([place])  03/2010 Initial Diagnosis   Renal cell carcinoma ([place])   04/03/2010 Cancer Staging   Staging form: Kidney, AJCC 7th Edition - Clinical stage from 04/03/2010: Stage I (T1a, N0, M0) - Signed by Derwood Kaplan, MD on 05/10/2020 Staged by: Managing physician Diagnostic confirmation:  Positive histology Specimen type: Excision Histopathologic type: Clear cell adenocarcinoma, NOS Stage prefix: Initial diagnosis Laterality: Left Tumor size (mm): 22 Histologic grade (G): G2 Lymph-vascular invasion (LVI): LVI not present (absent)/not identified Residual tumor (R): R0 - None Invasion beyond capsule into fat or perisinus tissues: Absent Adrenal extension: Negative Sarcomatoid features: Absent Tumor necrosis: Absent Presence of extranodal extension: Absent Stage used in treatment planning: Yes National guidelines used in treatment planning: Yes Type of national guideline used in treatment planning: NCCN   Cancer of lung, upper lobe ([place])  01/31/2019 Initial Diagnosis   Cancer of lung, upper lobe ([place])   02/10/2019 Cancer Staging   Staging form: Lung, AJCC 8th Edition - Clinical stage from 02/10/2019: Stage IA2 (  cT1b, cN0, cM0) - Signed by Derwood Kaplan, MD on 05/10/2020 Histopathologic type: Squamous cell carcinoma, NOS Stage prefix: Initial diagnosis Laterality: Right Tumor size (mm): 18 Lymph-vascular invasion (LVI): LVI not present (absent)/not identified Diagnostic confirmation: Positive histology Specimen type: Core Needle Biopsy Staged by: Managing physician Stage used in treatment planning: Yes National guidelines used in treatment planning: Yes Type of national guideline used in treatment planning: NCCN Staging comments: Stereotactic radiation      INTERVAL HISTORY:  Wong is here for follow up after being seen at [facility] ER for pain and redness at his Port-A-Cath site.   Chest x-ray did not reveal any acute cardiopulmonary disease.  There was stable right suprahilar post treatment scarring.  He states the pain and redness have resolved.  He feels he may have slept on it wrong.  He has an appointment with his surgeon coming up soon, but he does not want to have his port removed unless he has to.  He states he will cancel that appointment.   He denies cough, shortness of breath or chest pain.   He denies fevers or chills.  He continues to follow with pain management.  He states his chronic pain is well controlled with Horizant twice daily and hydromorphone 4 mg every 6 hours scheduled.  He is no longer on MS Contin. His appetite is good. His weight has increased 14 pounds over last 4 months .  His sister accompanies him and wants him to have the port removed.  Kratos is here for routine follow up and to review recent imaging results. CT imaging from March 10th revealed ***.   His  appetite is good, and he has gained/lost _ pounds since his last visit.  He denies fever, chills or other signs of infection.  He denies nausea, vomiting, bowel issues, or abdominal pain.  He denies sore throat, cough, dyspnea, or chest pain.  REVIEW OF SYSTEMS:  Review of Systems  Constitutional: Negative.  Negative for appetite change, chills, fatigue, fever and unexpected weight change.  HENT:  Negative.    Eyes: Negative.   Respiratory: Negative.  Negative for chest tightness, cough, hemoptysis, shortness of breath and wheezing.   Cardiovascular: Negative.  Negative for chest pain, leg swelling and palpitations.  Gastrointestinal: Negative.  Negative for abdominal distention, abdominal pain, blood in stool, constipation, diarrhea, nausea and vomiting.  Endocrine: Negative.   Genitourinary: Negative.  Negative for difficulty urinating, dysuria, frequency and hematuria.   Musculoskeletal: Negative.  Negative for arthralgias, back pain, flank pain, gait problem and myalgias.  Skin: Negative.   Neurological: Negative.  Negative for dizziness, extremity weakness, gait problem, headaches, light-headedness, numbness, seizures and speech difficulty.  Hematological: Negative.   Psychiatric/Behavioral: Negative.  Negative for depression and sleep disturbance. The patient is not nervous/anxious.   All other systems reviewed and are negative.   VITALS:  There  were no vitals taken for this visit.  Wt Readings from Last 3 Encounters:  04/03/21 282 lb 6.4 oz (128.1 kg)  12/04/20 268 lb 9.6 oz (121.8 kg)  06/03/20 265 lb 14.4 oz (120.6 kg)    There is no height or weight on file to calculate BMI.  Performance status (ECOG): 0 - Asymptomatic  PHYSICAL EXAM:  Physical Exam Constitutional:      General: He is not in acute distress.    Appearance: Normal appearance. He is normal weight.  HENT:     Head: Normocephalic and atraumatic.  Eyes:     General: No  scleral icterus.    Extraocular Movements: Extraocular movements intact.     Conjunctiva/sclera: Conjunctivae normal.     Pupils: Pupils are equal, round, and reactive to light.  Cardiovascular:     Rate and Rhythm: Normal rate and regular rhythm.     Pulses: Normal pulses.     Heart sounds: Normal heart sounds. No murmur heard.   No friction rub. No gallop.  Pulmonary:     Effort: Pulmonary effort is normal. No respiratory distress.     Breath sounds: Normal breath sounds.  Abdominal:     General: Bowel sounds are normal. There is no distension.     Palpations: Abdomen is soft. There is no hepatomegaly, splenomegaly or mass.     Tenderness: There is no abdominal tenderness.  Musculoskeletal:        General: Normal range of motion.     Cervical back: Normal range of motion and neck supple.     Right lower leg: No edema.     Left lower leg: No edema.  Lymphadenopathy:     Cervical: No cervical adenopathy.  Skin:    General: Skin is warm and dry.  Neurological:     General: No focal deficit present.     Mental Status: He is alert and oriented to person, place, and time. Mental status is at baseline.  Psychiatric:        Mood and Affect: Mood normal.        Behavior: Behavior normal.        Thought Content: Thought content normal.        Judgment: Judgment normal.    LABS:   CBC Latest Ref Rng & Units 04/01/2021 12/02/2020 06/03/2020  WBC - 7.6 11.6 9.6  Hemoglobin 13.5 - 17.5  11.5(A) 13.6 14.7  Hematocrit 41 - 53 33(A) 40(A) 44  Platelets 150 - 399 259 322 325   CMP Latest Ref Rng & Units 04/01/2021 12/02/2020 06/03/2020  Glucose 70 - 99 mg/dL - - -  BUN 4 - 21 13 17 18   Creatinine 0.6 - 1.3 0.9 1.1 0.8  Sodium 137 - 147 138 137 137  Potassium 3.4 - 5.3 4.2 4.2 4.2  Chloride 99 - 108 102 99 104  CO2 13 - 22 28(A) 27(A) 27(A)  Calcium 8.7 - 10.7 9.3 9.4 9.3  Total Protein 6.0 - 8.3 g/dL - - -  Total Bilirubin 0.3 - 1.2 mg/dL - - -  Alkaline Phos 25 - 125 - 75 70  AST 14 - 40 - 25 27  ALT 10 - 40 - 21 27     Lab Results  Component Value Date   CEA1 1.8 12/02/2020   /  CEA  Date Value Ref Range Status  12/02/2020 1.8  Final    STUDIES:  No results found.    HISTORY:   Allergies: No Known Allergies  Current Medications: Current Outpatient Medications  Medication Sig Dispense Refill   amitriptyline (ELAVIL) 50 MG tablet Take 50 mg by mouth at bedtime.     aspirin EC 81 MG tablet Take 81 mg by mouth daily.     atorvastatin (LIPITOR) 80 MG tablet Take 80 mg by mouth daily at 6 PM.      carvedilol (COREG) 3.125 MG tablet Take 3.125 mg by mouth 2 (two) times daily with a meal.      diclofenac (VOLTAREN) 50 MG EC tablet Take by mouth.     Dulaglutide (TRULICITY) 1.5 OV/7.8HY SOPN Inject 1.5 mg into  the skin every 7 (seven) days.      erythromycin ophthalmic ointment SMARTSIG:In Eye(s) (Patient not taking: Reported on 04/03/2021)     ezetimibe (ZETIA) 10 MG tablet TAKE 1 TABLET BY MOUTH DAILY FOR CHOLESTEROL     FLUoxetine (PROZAC) 20 MG capsule Take 40 mg by mouth 2 (two) times daily.     furosemide (LASIX) 20 MG tablet Take 20 mg by mouth.     gabapentin (NEURONTIN) 300 MG capsule  (Patient not taking: Reported on 04/03/2021)     HORIZANT 600 MG TBCR Take 1 tablet by mouth 2 (two) times daily. (Patient not taking: Reported on 04/03/2021)     HYDROmorphone (DILAUDID) 4 MG tablet Take 4 mg by mouth every 6 (six) hours as needed.     latanoprost  (XALATAN) 0.005 % ophthalmic solution 1 drop at bedtime.     morphine (MS CONTIN) 30 MG 12 hr tablet Take 30 mg by mouth 2 (two) times daily as needed. (Patient not taking: Reported on 04/03/2021)     OLANZapine (ZYPREXA) 10 MG tablet Take 10 mg by mouth at bedtime.     spironolactone (ALDACTONE) 25 MG tablet Take 25 mg by mouth daily.     No current facility-administered medications for this visit.    I, Rita Ohara, am acting as scribe for Derwood Kaplan, MD  I have reviewed this report as typed by the medical scribe, and it is complete and accurate.

## 2021-05-30 LAB — HEPATIC FUNCTION PANEL
ALT: 34 U/L (ref 10–40)
AST: 29 (ref 14–40)
Alkaline Phosphatase: 69 (ref 25–125)
Bilirubin, Total: 0.4

## 2021-05-30 LAB — COMPREHENSIVE METABOLIC PANEL
Albumin: 4.2 (ref 3.5–5.0)
Calcium: 9.4 (ref 8.7–10.7)

## 2021-05-30 LAB — BASIC METABOLIC PANEL
BUN: 18 (ref 4–21)
CO2: 28 — AB (ref 13–22)
Chloride: 103 (ref 99–108)
Creatinine: 1.1 (ref 0.6–1.3)
Glucose: 128
Potassium: 4.5 mEq/L (ref 3.5–5.1)
Sodium: 139 (ref 137–147)

## 2021-05-30 LAB — CBC AND DIFFERENTIAL
HCT: 42 (ref 41–53)
Hemoglobin: 13.8 (ref 13.5–17.5)
Neutrophils Absolute: 6.18
Platelets: 334 10*3/uL (ref 150–400)
WBC: 9.5

## 2021-05-30 LAB — CBC: RBC: 4.79 (ref 3.87–5.11)

## 2021-05-30 LAB — CEA: CEA: 1.4

## 2021-06-03 ENCOUNTER — Other Ambulatory Visit: Payer: Self-pay

## 2021-06-03 ENCOUNTER — Telehealth: Payer: Self-pay | Admitting: Oncology

## 2021-06-03 ENCOUNTER — Other Ambulatory Visit: Payer: Self-pay | Admitting: Oncology

## 2021-06-03 ENCOUNTER — Inpatient Hospital Stay: Payer: Medicare Other | Attending: Hematology and Oncology | Admitting: Oncology

## 2021-06-03 ENCOUNTER — Encounter: Payer: Self-pay | Admitting: Oncology

## 2021-06-03 VITALS — BP 130/76 | HR 94 | Temp 98.3°F | Resp 18 | Ht 68.0 in | Wt 272.7 lb

## 2021-06-03 DIAGNOSIS — C642 Malignant neoplasm of left kidney, except renal pelvis: Secondary | ICD-10-CM | POA: Diagnosis not present

## 2021-06-03 DIAGNOSIS — C3411 Malignant neoplasm of upper lobe, right bronchus or lung: Secondary | ICD-10-CM

## 2021-06-03 NOTE — Telephone Encounter (Signed)
Per 06/03/21 los next appt scheduled and confirmed with patient ?

## 2021-06-06 ENCOUNTER — Encounter: Payer: Self-pay | Admitting: Hematology and Oncology

## 2021-12-02 NOTE — Progress Notes (Signed)
[facility]  [address] [place],  [place]  [address] [phone]  Clinic Day:  12/04/2021  Referring physician: Algis Greenhouse, MD   CHIEF COMPLAINT:  CC: History of multiple malignancies, most recently a stage I non small cell lung cancer in the right upper lobe  Current Treatment:  Surveillance  HISTORY OF PRESENT ILLNESS:  Jeremy Logan. is a 61 y.o. male with a history of stage IIIA non-small cell lung cancer diagnosed in August of 2010 when he presented with a large Pancoast tumor measuring over 8 cm in diameter with destruction of T1 and T2 vertebral bodies.  He received concurrent chemotherapy and radiation with weekly carboplatin and paclitaxel and completed 8 cycles of the chemotherapy.  He has never had any evidence of recurrence, but continues to have severe pain of the right shoulder and neck, which is felt to be from long-term neurologic damage.  He was incidentally found to have a stage I renal cell carcinoma which was treated with a partial left nephrectomy in January 2012 at [facility], and he has not had any recurrence of that either.  However, he has chronic pain of pain in the left flank where his abdomen bulges from his surgery.  He also had an umbilical hernia which became incarcerated and required surgery in 2012. He had opioid dependence, but was tapered off his narcotics last year.  He is not a candidate for methadone because of his other medications.  He also has a diagnosis of depression and bipolar disease.  CT chest in March 2016 revealed no evidence of malignancy, but he had extensive damage to the upper thoracic spine from his prior malignancy.  He was extremely short of breath when he saw Dr. Hinton Rao in November 2016 and ended up hospitalized for acute respiratory failure for 7 weeks through January 2017.  He ended up with a temporary tracheostomy.  He has insulin dependent diabetic, likely from frequent steroids.   In August 2017, he had severe swelling of the left lower extremity and was found to have extensive acute deep venous thrombosis throughout most of the leg.  He was later seen in the emergency room due to dyspnea and CT a chest revealed pulmonary emboli with evidence of right heart strain.  He was hospitalized on intravenous heparin, he was switched to Lovenox upon discharge and later switched to Xarelto.  Repeat CTA chest in February 2018 revealed resolution of the pulmonary embolism.  As he had been on anticoagulation for 6 months, Xarelto was discontinued at that time.  He presented to the emergency room with dyspnea and chest pain again in November 2018. CTA chest did not reveal any evidence of pulmonary embolism.  There was a new 7 mm right upper lobe nodule.  Repeat CT imaging in March 2019 revealed new mild mediastinal and bilateral hilar adenopathy measuring up to 1.4 cm in diameter.  An AP window lymph node had increased from 8 mm to 13 mm.  There was enlargement of spiculated nodule in the anterior right upper lobe measuring 1.5 cm, compared to 0.9 cm previously.  There were stable post radiation changes in the right upper paramediastinal area.  There were radiation changes of the right posterior 2nd rib and T2 vertebral body.  The CEA was up to 6.2.  He underwent PET scan for further evaluation.  This did not reveal hypermetabolic activity in the right upper lobe measuring 14 mm.  There was a focus of  consolidation in the right upper lobe suggestive of radiation changes, also without hypermetabolic activity.  No hypermetabolic activity was seen in the mediastinal lymph nodes.  When he was seen in May of 2019, the CEA had gone back down to 3.2.  CT chest in July revealed a slight decrease in the right upper lobe pulmonary nodule, from 1.5 cm to 1.3 cm. Repeat CT chest in October revealed further decrease of the right upper lobe nodule, now measuring 1.1 cm, as well as a 9 mm subcarinal node, which is not  pathologically enlarged.  He quit smoking about 2 years ago.  Unfortunately, he does vape.  His CT scan from June 16th 2020 revealed that the anterior right lobe pulmonary nodule had progressed in the interva.  CT scan from October 16th 2020 revealed an interval increase in size of a spiculated nodule of the anterior right upper lobe measuring 1.8 x 0.9 cm, previously 0.9 x 0.7 cm, highly concerning for recurrent or metachronous primary lung malignancy.  He then underwent a PET scan which showed the 1.8 cm spiculated right upper lobe nodule, suspicious for recurrent or metachronous primary bronchogenic neoplasm, less likely metastasis.  The SUV was measured to be 6.1.  His CEA continues to be within the normal range, the last being 1.9.  He underwent a CT guided biopsy on the isolated right lung nodule on November 10th.  This is consistent with a non-small cell type, favor squamous cell.  This is likely a second primary lung cancer, but appears to be a stage I.  The original pathology from 10 years ago was read as large cell lung carcinoma.  Since this is an isolated nodule, I would not recommend chemotherapy and he is not a good candidate for surgical resection.  He was treated with stereotactic radiation at [facility].  PFT's are consistent with asthma among other diagnoses.  He completed stereotactic radiation at [facility] in [place] in December 2020.  CT imaging from March 2021 revealed slight interval decrease in the right upper lobe nodule now measuring 1.8 x 1.0 cm, previously 2.1 x 1.1 cm.  CT chest from August 2021 revealed increased soft tissue density at the site of anterior right upper lobe treated pulmonary nodule, favored to be secondary to evolving radiation change.  CT imaging from February 2022 were stable.  INTERVAL HISTORY:  [name] is here for routine follow up and states that he is feeling good and denies any complaints.Blood counts ar normal. Blood sugar is at 266 which may be because  he ate but otherwise chemistries are normal. His  appetite is good, and he has gained 14 pounds since his last visit. He denies fever, chills or other signs of infection.  He denies nausea, vomiting, bowel issues, or abdominal pain.  He denies sore throat, cough, dyspnea, or chest pain.  REVIEW OF SYSTEMS:  Review of Systems  Constitutional: Negative.  Negative for appetite change, chills, fatigue, fever and unexpected weight change.  HENT:  Negative.    Eyes: Negative.   Respiratory:  Positive for wheezing. Negative for chest tightness, cough, hemoptysis and shortness of breath.        He reports minor wheezing  Cardiovascular: Negative.  Negative for chest pain, leg swelling and palpitations.  Gastrointestinal: Negative.  Negative for abdominal distention, abdominal pain, blood in stool, constipation, diarrhea, nausea and vomiting.  Endocrine: Negative.   Genitourinary:  Negative for difficulty urinating, dysuria, frequency and hematuria.   Musculoskeletal:  Positive for flank pain (left,  chronic). Negative for arthralgias, back pain, gait problem and myalgias.       Right shoulder pain, chronic  Skin: Negative.   Neurological: Negative.  Negative for dizziness, extremity weakness, gait problem, headaches, light-headedness, numbness, seizures and speech difficulty.  Hematological: Negative.   Psychiatric/Behavioral: Negative.  Negative for depression and sleep disturbance. The patient is not nervous/anxious.   All other systems reviewed and are negative.    VITALS:  Blood pressure 134/75, pulse 97, temperature 98.5 F (36.9 C), temperature source Oral, resp. rate 19, height 5\' 8"  (1.727 m), weight 286 lb 3.2 oz (129.8 kg), SpO2 94 %.  Wt Readings from Last 3 Encounters:  12/04/21 286 lb 3.2 oz (129.8 kg)  06/03/21 272 lb 11.2 oz (123.7 kg)  04/03/21 282 lb 6.4 oz (128.1 kg)    Body mass index is 43.52 kg/m.  Performance status (ECOG): 1 - Symptomatic but completely  ambulatory  PHYSICAL EXAM:  Physical Exam Constitutional:      General: He is not in acute distress.    Appearance: Normal appearance. He is normal weight. He is not ill-appearing or toxic-appearing.  HENT:     Head: Normocephalic and atraumatic.  Eyes:     General: No scleral icterus.    Extraocular Movements: Extraocular movements intact.     Conjunctiva/sclera: Conjunctivae normal.     Pupils: Pupils are equal, round, and reactive to light.  Cardiovascular:     Rate and Rhythm: Normal rate and regular rhythm.     Pulses: Normal pulses.     Heart sounds: Normal heart sounds. No murmur heard.    No friction rub. No gallop.  Pulmonary:     Effort: Pulmonary effort is normal. No respiratory distress.     Breath sounds: Normal breath sounds. No wheezing or rales.  Abdominal:     General: Bowel sounds are normal. There is no distension.     Palpations: Abdomen is soft. There is no hepatomegaly, splenomegaly or mass.     Tenderness: There is no abdominal tenderness.  Musculoskeletal:        General: Normal range of motion.     Cervical back: Normal range of motion and neck supple.     Right lower leg: No edema.     Left lower leg: No edema.  Lymphadenopathy:     Cervical: No cervical adenopathy.  Skin:    General: Skin is warm and dry.  Neurological:     General: No focal deficit present.     Mental Status: He is alert and oriented to person, place, and time. Mental status is at baseline.  Psychiatric:        Mood and Affect: Mood normal.        Behavior: Behavior normal.        Thought Content: Thought content normal.        Judgment: Judgment normal.     LABS:      Latest Ref Rng & Units 12/04/2021   12:00 AM 05/30/2021   12:00 AM 04/01/2021   12:00 AM  CBC  WBC  10.6     9.5     7.6      Hemoglobin 13.5 - 17.5 14.1     13.8     11.5      Hematocrit 41 - 53 42     42     33      Platelets 150 - 400 K/uL 316     334     259  This result is from an external  source.      Latest Ref Rng & Units 12/04/2021   12:00 AM 05/30/2021   12:00 AM 04/01/2021   12:00 AM  CMP  BUN 4 - 21 15     18     13       Creatinine 0.6 - 1.3 0.9     1.1     0.9      Sodium 137 - 147 134     139     138      Potassium 3.5 - 5.1 mEq/L 4.4     4.5     4.2      Chloride 99 - 108 99     103     102      CO2 13 - 22 28     28     28       Calcium 8.7 - 10.7 8.9     9.4     9.3      Alkaline Phos 25 - 125 71     69       AST 14 - 40 36     29       ALT 10 - 40 U/L 45     34          This result is from an external source.     Lab Results  Component Value Date   CEA1 1.9 12/04/2021   CEA 1.4 05/30/2021   /  CEA  Date Value Ref Range Status  12/04/2021 1.9 0.0 - 4.7 ng/mL Final    Comment:    (NOTE)                             Nonsmokers          <3.9                             Smokers             <5.6 [employer] Electrochemiluminescence Immunoassay (ECLIA) Values obtained with different assay methods or kits cannot be used interchangeably.  Results cannot be interpreted as absolute evidence of the presence or absence of malignant disease. Performed At: [facility] [place], [place] [id number] Rush Farmer MD [id number]   05/30/2021 1.4  Final     STUDIES:  No results found.  EXAM:05/30/21 CT CHEST WITH CONTRAST IMPRESSION: No evidence of recurrent or metastatic disease. Post radiation scarring in the apical right upper lobe is well surrounding osseous structures, as detailed above. Aortic atherosclerosis Emphysema   EXAM: 12/02/2020 CT CHEST WITH CONTRAST  TECHNIQUE: Multidetector CT imaging of the chest was performed during intravenous contrast administration.  CONTRAST:  60 mL Isovue 370 iodinated contrast IV  COMPARISON:  06/06/2020  FINDINGS: Cardiovascular: Left chest port catheter. Scattered aortic atherosclerosis. Normal heart size. No pericardial effusion.  Mediastinum/Nodes: No enlarged  mediastinal, hilar, or axillary lymph nodes. Thyroid gland, trachea, and esophagus demonstrate no significant findings.  Lungs/Pleura: Unchanged post treatment/post radiation fibrosis of the paramedian suprahilar right upper lobe (series 4, image 23). Mild centrilobular emphysema. Interval resolution of previously seen scattered small ground-glass opacities of the right lower lobe. No pleural effusion or pneumothorax.  Upper Abdomen: No acute abnormality. Partial superior pole left nephrectomy (series 2, image 124) unchanged small, partially calcified focus of fat necrosis posterior to the  spleen and underlying the left hemidiaphragm (series 2, image 110).  Musculoskeletal: No chest wall mass. Unchanged sclerosis and erosion of the posterior right second rib and adjacent T2 vertebral body (series 4, image 14).  IMPRESSION: 1. Unchanged post treatment/post radiation fibrosis of the paramedian suprahilar right upper lobe. No evidence of recurrent or metastatic malignancy in the chest. 2. Interval resolution of previously seen scattered small ground-glass opacities of the right lower lobe, consistent with resolved infection or inflammation. 3. Unchanged sclerosis and erosion of the posterior right second rib and adjacent T2 vertebral body, stable over multiple prior examinations and likely sequelae of local radiation. 4. Emphysema. 5. Partial superior pole left nephrectomy.  Aortic Atherosclerosis (ICD10-I70.0) and Emphysema (ICD10-J43.9).  Allergies:  Allergies  Allergen Reactions   Atorvastatin     Other reaction(s): Myalgias (intolerance) 08/07/2021: prob not related    Current Medications: Current Outpatient Medications  Medication Sig Dispense Refill   Dulaglutide 4.5 MG/0.5ML SOPN Inject into the skin.     aspirin EC 81 MG tablet Take 81 mg by mouth daily.     buPROPion (WELLBUTRIN XL) 150 MG 24 hr tablet Take 150 mg by mouth every morning.     carvedilol (COREG)  3.125 MG tablet Take 3.125 mg by mouth 2 (two) times daily with a meal.      ezetimibe (ZETIA) 10 MG tablet TAKE 1 TABLET BY MOUTH DAILY FOR CHOLESTEROL     FLUoxetine (PROZAC) 20 MG capsule Take 40 mg by mouth 2 (two) times daily.     furosemide (LASIX) 20 MG tablet Take 20 mg by mouth.     glimepiride (AMARYL) 2 MG tablet Take 2 mg by mouth every morning.     HYDROmorphone (DILAUDID) 4 MG tablet Take 4 mg by mouth every 6 (six) hours as needed.     OLANZapine (ZYPREXA) 10 MG tablet Take 10 mg by mouth at bedtime.     pregabalin (LYRICA) 75 MG capsule Take 75 mg by mouth 2 (two) times daily.     spironolactone (ALDACTONE) 25 MG tablet Take 25 mg by mouth daily.     No current facility-administered medications for this visit.     ASSESSMENT & PLAN:   Assessment:   1. 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     2. Stage I renal cell carcinoma January 2012 treated with a partial left nephrectomy.  He has not had evidence of recurrence.  3. 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 CEA was normal.  This appears to be more consistent with a squamous cell type.  It was confirmed to be an isolated lesion and he completed stereotactic radiation at [facility] in [place] in December 2020.    4. 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  5. Severe COPD.  He did have an episode of respiratory failure in November of 2016 requiring a temp tracheostomy.  6. Deep venous thrombosis in August of 2017 with associated pulmonary emboli.  7. Depression and bipolar disease.  8. Severe insulin-dependent diabetes mellitus, well controlled at this time.    9. History of substance abuse.  10. Surgery for incarcerated umbilical hernia in 0960.    Plan: His port will be  flushed today. We will plan to see him back in 6 months  with CBC, CMP, CEA, and CT chest for reevaluation.  The patient understands the plans discussed today and is in agreement with them.  He knows to contact our office if he develops concerns prior to his next appointment.   I provided 25 minutes of face-to-face time during this this encounter and > 50% was spent counseling as documented under my assessment and plan.    Derwood Kaplan, MD [facility] AT [facility] [address] [place] [address] Dept: [phone] Dept Fax: [phone]    Ninetta Lights as a scribe for Derwood Kaplan, MD.,have documented all relevant documentation on the behalf of Derwood Kaplan, MD,as directed by  Derwood Kaplan, MD while in the presence of Derwood Kaplan, MD.   I have reviewed this report as typed by the medical scribe, and it is complete and accurate.  Derwood Kaplan, MD

## 2021-12-04 ENCOUNTER — Inpatient Hospital Stay: Payer: Medicare Other

## 2021-12-04 ENCOUNTER — Encounter: Payer: Self-pay | Admitting: Oncology

## 2021-12-04 ENCOUNTER — Inpatient Hospital Stay: Payer: Medicare Other | Attending: Oncology | Admitting: Oncology

## 2021-12-04 ENCOUNTER — Other Ambulatory Visit: Payer: Self-pay | Admitting: Oncology

## 2021-12-04 VITALS — BP 134/75 | HR 97 | Temp 98.5°F | Resp 19 | Ht 68.0 in | Wt 286.2 lb

## 2021-12-04 DIAGNOSIS — C642 Malignant neoplasm of left kidney, except renal pelvis: Secondary | ICD-10-CM | POA: Diagnosis not present

## 2021-12-04 DIAGNOSIS — Z85528 Personal history of other malignant neoplasm of kidney: Secondary | ICD-10-CM | POA: Insufficient documentation

## 2021-12-04 DIAGNOSIS — Z905 Acquired absence of kidney: Secondary | ICD-10-CM | POA: Insufficient documentation

## 2021-12-04 DIAGNOSIS — Z86711 Personal history of pulmonary embolism: Secondary | ICD-10-CM | POA: Diagnosis not present

## 2021-12-04 DIAGNOSIS — E119 Type 2 diabetes mellitus without complications: Secondary | ICD-10-CM | POA: Insufficient documentation

## 2021-12-04 DIAGNOSIS — Z86718 Personal history of other venous thrombosis and embolism: Secondary | ICD-10-CM | POA: Insufficient documentation

## 2021-12-04 DIAGNOSIS — C3411 Malignant neoplasm of upper lobe, right bronchus or lung: Secondary | ICD-10-CM

## 2021-12-04 DIAGNOSIS — Z923 Personal history of irradiation: Secondary | ICD-10-CM | POA: Diagnosis not present

## 2021-12-04 DIAGNOSIS — Z87891 Personal history of nicotine dependence: Secondary | ICD-10-CM | POA: Diagnosis not present

## 2021-12-04 DIAGNOSIS — Z794 Long term (current) use of insulin: Secondary | ICD-10-CM | POA: Diagnosis not present

## 2021-12-04 LAB — BASIC METABOLIC PANEL
BUN: 15 (ref 4–21)
CO2: 28 — AB (ref 13–22)
Chloride: 99 (ref 99–108)
Creatinine: 0.9 (ref 0.6–1.3)
Glucose: 266
Potassium: 4.4 mEq/L (ref 3.5–5.1)
Sodium: 134 — AB (ref 137–147)

## 2021-12-04 LAB — COMPREHENSIVE METABOLIC PANEL
Albumin: 4.2 (ref 3.5–5.0)
Calcium: 8.9 (ref 8.7–10.7)

## 2021-12-04 LAB — CBC AND DIFFERENTIAL
HCT: 42 (ref 41–53)
Hemoglobin: 14.1 (ref 13.5–17.5)
Neutrophils Absolute: 6.25
Platelets: 316 10*3/uL (ref 150–400)
WBC: 10.6

## 2021-12-04 LAB — HEPATIC FUNCTION PANEL
ALT: 45 U/L — AB (ref 10–40)
AST: 36 (ref 14–40)
Alkaline Phosphatase: 71 (ref 25–125)
Bilirubin, Total: 0.4

## 2021-12-04 LAB — CBC: RBC: 4.74 (ref 3.87–5.11)

## 2021-12-05 LAB — CEA: CEA: 1.9 ng/mL (ref 0.0–4.7)

## 2021-12-24 ENCOUNTER — Encounter: Payer: Self-pay | Admitting: Hematology and Oncology

## 2022-06-02 LAB — CBC AND DIFFERENTIAL
HCT: 42 (ref 41–53)
Hemoglobin: 13.8 (ref 13.5–17.5)
Neutrophils Absolute: 7.3
Platelets: 286 10*3/uL (ref 150–400)
WBC: 10.9

## 2022-06-02 LAB — BASIC METABOLIC PANEL
BUN: 25 — AB (ref 4–21)
CO2: 27 — AB (ref 13–22)
Chloride: 105 (ref 99–108)
Creatinine: 1.2 (ref 0.6–1.3)
Glucose: 97
Potassium: 4.7 mEq/L (ref 3.5–5.1)
Sodium: 139 (ref 137–147)

## 2022-06-02 LAB — HEPATIC FUNCTION PANEL
ALT: 26 U/L (ref 10–40)
AST: 31 (ref 14–40)
Alkaline Phosphatase: 48 (ref 25–125)
Bilirubin, Total: 0.3

## 2022-06-02 LAB — COMPREHENSIVE METABOLIC PANEL
Albumin: 4.3 (ref 3.5–5.0)
Calcium: 9.1 (ref 8.7–10.7)

## 2022-06-02 LAB — CBC: RBC: 4.67 (ref 3.87–5.11)

## 2022-06-04 ENCOUNTER — Inpatient Hospital Stay: Payer: Medicare Other | Attending: Oncology | Admitting: Oncology

## 2022-06-04 ENCOUNTER — Encounter: Payer: Self-pay | Admitting: Oncology

## 2022-06-04 ENCOUNTER — Other Ambulatory Visit: Payer: Self-pay | Admitting: Oncology

## 2022-06-04 VITALS — BP 117/78 | HR 80 | Temp 98.2°F | Resp 19 | Ht 68.0 in | Wt 260.1 lb

## 2022-06-04 DIAGNOSIS — C3411 Malignant neoplasm of upper lobe, right bronchus or lung: Secondary | ICD-10-CM

## 2022-06-04 DIAGNOSIS — K769 Liver disease, unspecified: Secondary | ICD-10-CM

## 2022-06-04 DIAGNOSIS — C642 Malignant neoplasm of left kidney, except renal pelvis: Secondary | ICD-10-CM

## 2022-06-04 LAB — CEA: CEA: 1.2

## 2022-06-04 NOTE — Progress Notes (Signed)
[facility]  [address] [place],  [place]  [address] [phone]  Clinic Day: 06/04/2022  Referring physician: Olive Bass, MD   CHIEF COMPLAINT:  CC: History of multiple malignancies, most recently a stage I non small cell lung cancer in the right upper lobe  Current Treatment:  Surveillance  HISTORY OF PRESENT ILLNESS:  Jeremy Logan. is a 62 y.o. male with a history of stage IIIA non-small cell lung cancer diagnosed in August of 2010 when he presented with a large Pancoast tumor measuring over 8 cm in diameter with destruction of T1 and T2 vertebral bodies.  He received concurrent chemotherapy and radiation with weekly carboplatin and paclitaxel and completed 8 cycles of the chemotherapy.  He has never had any evidence of recurrence, but continues to have severe pain of the right shoulder and neck, which is felt to be from long-term neurologic damage.  He was incidentally found to have a stage I renal cell carcinoma which was treated with a partial left nephrectomy in January 2012 at [facility], and he has not had any recurrence of that either.  However, he has chronic pain of pain in the left flank where his abdomen bulges from his surgery.  He also had an umbilical hernia which became incarcerated and required surgery in 2012. He had opioid dependence, but was tapered off his narcotics last year.  He is not a candidate for methadone because of his other medications.  He also has a diagnosis of depression and bipolar disease.  CT chest in March 2016 revealed no evidence of malignancy, but he had extensive damage to the upper thoracic spine from his prior malignancy.  He was extremely short of breath when he saw Dr. Gilman Buttner in November 2016 and ended up hospitalized for acute respiratory failure for 7 weeks through January 2017.  He ended up with a temporary tracheostomy.  He has insulin dependent diabetic, likely from frequent steroids.   In August 2017, he had severe swelling of the left lower extremity and was found to have extensive acute deep venous thrombosis throughout most of the leg.  He was later seen in the emergency room due to dyspnea and CT a chest revealed pulmonary emboli with evidence of right heart strain.  He was hospitalized on intravenous heparin, he was switched to Lovenox upon discharge and later switched to Xarelto.  Repeat CTA chest in February 2018 revealed resolution of the pulmonary embolism.  As he had been on anticoagulation for 6 months, Xarelto was discontinued at that time.  He presented to the emergency room with dyspnea and chest pain again in November 2018. CTA chest did not reveal any evidence of pulmonary embolism.  There was a new 7 mm right upper lobe nodule.  Repeat CT imaging in March 2019 revealed new mild mediastinal and bilateral hilar adenopathy measuring up to 1.4 cm in diameter.  An AP window lymph node had increased from 8 mm to 13 mm.  There was enlargement of spiculated nodule in the anterior right upper lobe measuring 1.5 cm, compared to 0.9 cm previously.  There were stable post radiation changes in the right upper paramediastinal area.  There were radiation changes of the right posterior 2nd rib and T2 vertebral body.  The CEA was up to 6.2.  He underwent PET scan for further evaluation.  This did not reveal hypermetabolic activity in the right upper lobe measuring 14 mm.  There was a focus of consolidation  in the right upper lobe suggestive of radiation changes, also without hypermetabolic activity.  No hypermetabolic activity was seen in the mediastinal lymph nodes.  When he was seen in May of 2019, the CEA had gone back down to 3.2.  CT chest in July revealed a slight decrease in the right upper lobe pulmonary nodule, from 1.5 cm to 1.3 cm. Repeat CT chest in October revealed further decrease of the right upper lobe nodule, now measuring 1.1 cm, as well as a 9 mm subcarinal node, which is not  pathologically enlarged.  He quit smoking about 2 years ago.  Unfortunately, he does vape.  His CT scan from June 16th 2020 revealed that the anterior right lobe pulmonary nodule had progressed in the interva.  CT scan from October 16th 2020 revealed an interval increase in size of a spiculated nodule of the anterior right upper lobe measuring 1.8 x 0.9 cm, previously 0.9 x 0.7 cm, highly concerning for recurrent or metachronous primary lung malignancy.  He then underwent a PET scan which showed the 1.8 cm spiculated right upper lobe nodule, suspicious for recurrent or metachronous primary bronchogenic neoplasm, less likely metastasis.  The SUV was measured to be 6.1.  His CEA continues to be within the normal range, the last being 1.9.  He underwent a CT guided biopsy on the isolated right lung nodule on November 10th.  This is consistent with a non-small cell type, favor squamous cell.  This is likely a second primary lung cancer, but appears to be a stage I.  The original pathology from 10 years ago was read as large cell lung carcinoma.  Since this is an isolated nodule, I would not recommend chemotherapy and he is not a good candidate for surgical resection.  He was treated with stereotactic radiation at [facility].  PFT's are consistent with asthma among other diagnoses.  He completed stereotactic radiation at [facility] in [place] in December 2020.  CT imaging from March 2021 revealed slight interval decrease in the right upper lobe nodule now measuring 1.8 x 1.0 cm, previously 2.1 x 1.1 cm.  CT chest from August 2021 revealed increased soft tissue density at the site of anterior right upper lobe treated pulmonary nodule, favored to be secondary to evolving radiation change.  CT imaging from February 2022 were stable.  INTERVAL HISTORY:  Leemon is here for routine follow up and states that he is now being seen at the pain clinic. CT chest scan reveals an area in the right lower lobe about 9 mm which  may be consistent with congestion. We will repeat scans again in 3 months. There is a subtle focus of increased attenuation in the subcapsular left liver and gallbladder that may be fatty liver and an MRI of the liver is recommended for further evaluation. It also revealed swelling in his right neck. BUN is slightly elevated at 25, otherwise everything else is normal. CEA test was normal. His  appetite is good, and he has gained 3 1/2 pounds since his last visit.  He denies fever, chills or other signs of infection.  He denies nausea, vomiting, bowel issues, or abdominal pain.  He denies sore throat, cough, dyspnea, or chest pain.  REVIEW OF SYSTEMS:  Review of Systems  Constitutional: Negative.  Negative for appetite change, chills, fatigue, fever and unexpected weight change.  HENT:  Negative.    Eyes: Negative.   Respiratory: Negative.  Negative for chest tightness, cough, hemoptysis, shortness of breath and wheezing.  Cardiovascular: Negative.  Negative for chest pain, leg swelling and palpitations.  Gastrointestinal: Negative.  Negative for abdominal distention, abdominal pain, blood in stool, constipation, diarrhea, nausea and vomiting.  Endocrine: Negative.   Genitourinary:  Negative for difficulty urinating, dysuria, frequency and hematuria.   Musculoskeletal:  Positive for flank pain (left, chronic). Negative for arthralgias, back pain, gait problem and myalgias.       Right shoulder pain, chronic  Skin: Negative.   Neurological: Negative.  Negative for dizziness, extremity weakness, gait problem, headaches, light-headedness, numbness, seizures and speech difficulty.  Hematological: Negative.   Psychiatric/Behavioral: Negative.  Negative for depression and sleep disturbance. The patient is not nervous/anxious.   All other systems reviewed and are negative.    VITALS:  Blood pressure 117/78, pulse 80, temperature 98.2 F (36.8 C), temperature source Oral, resp. rate 19, height 5\' 8"   (1.727 m), weight 260 lb 1.6 oz (118 kg), SpO2 95 %.  Wt Readings from Last 3 Encounters:  06/04/22 260 lb 1.6 oz (118 kg)  12/04/21 286 lb 3.2 oz (129.8 kg)  06/03/21 272 lb 11.2 oz (123.7 kg)    Body mass index is 39.55 kg/m.  Performance status (ECOG): 1 - Symptomatic but completely ambulatory  PHYSICAL EXAM:  Physical Exam Constitutional:      General: He is not in acute distress.    Appearance: Normal appearance. He is normal weight.  HENT:     Head: Normocephalic and atraumatic.  Eyes:     General: No scleral icterus.    Extraocular Movements: Extraocular movements intact.     Conjunctiva/sclera: Conjunctivae normal.     Pupils: Pupils are equal, round, and reactive to light.  Cardiovascular:     Rate and Rhythm: Normal rate and regular rhythm.     Pulses: Normal pulses.     Heart sounds: Normal heart sounds. No murmur heard.    No friction rub. No gallop.  Pulmonary:     Effort: Pulmonary effort is normal. No respiratory distress.     Breath sounds: Rhonchi present.     Comments: Occasional rhonchi the left side Abdominal:     General: Bowel sounds are normal. There is no distension.     Palpations: Abdomen is soft. There is no hepatomegaly, splenomegaly or mass.     Tenderness: There is no abdominal tenderness.  Musculoskeletal:        General: Normal range of motion.     Cervical back: Normal range of motion and neck supple.     Right lower leg: No edema.     Left lower leg: No edema.  Lymphadenopathy:     Cervical: No cervical adenopathy.  Skin:    General: Skin is warm and dry.  Neurological:     General: No focal deficit present.     Mental Status: He is alert and oriented to person, place, and time. Mental status is at baseline.  Psychiatric:        Mood and Affect: Mood normal.        Behavior: Behavior normal.        Thought Content: Thought content normal.        Judgment: Judgment normal.     LABS:      Latest Ref Rng & Units 06/02/2022    12:00 AM 12/04/2021   12:00 AM 05/30/2021   12:00 AM  CBC  WBC  10.9     10.6     9.5      Hemoglobin 13.5 - 17.5 13.8  14.1     13.8      Hematocrit 41 - 53 42     42     42      Platelets 150 - 400 K/uL 286     316     334         This result is from an external source.      Latest Ref Rng & Units 06/02/2022   12:00 AM 12/04/2021   12:00 AM 05/30/2021   12:00 AM  CMP  BUN 4 - 21 25     15     18       Creatinine 0.6 - 1.3 1.2     0.9     1.1      Sodium 137 - 147 139     134     139      Potassium 3.5 - 5.1 mEq/L 4.7     4.4     4.5      Chloride 99 - 108 105     99     103      CO2 13 - 22 27     28     28       Calcium 8.7 - 10.7 9.1     8.9     9.4      Alkaline Phos 25 - 125 48     71     69      AST 14 - 40 31     36     29      ALT 10 - 40 U/L 26     45     34         This result is from an external source.     Lab Results  Component Value Date   CEA1 1.9 12/04/2021   CEA 1.2 06/02/2022   /  CEA  Date Value Ref Range Status  06/02/2022 1.2  Final  12/04/2021 1.9 0.0 - 4.7 ng/mL Final    Comment:    (NOTE)                             Nonsmokers          <3.9                             Smokers             <5.6 [employer] Electrochemiluminescence Immunoassay (ECLIA) Values obtained with different assay methods or kits cannot be used interchangeably.  Results cannot be interpreted as absolute evidence of the presence or absence of malignant disease. Performed At: [facility] [address] [place], [place] [id number] Jolene Schimke MD WU:[id number]      STUDIES:   EXAM:06/02/22 CT CHEST WITH CONTRAST IMPRESSION: Interval resolution of the interstitial opacity and patchy airspace consolidation seen previously in the right middle lobe and anterior right lower lobe.  Cluster of nodularity in the posterior right lower lobe noted today including dominant 9 mm right lower lobe nodule. Imaging features are most likely infectious/inflammatory  but close up follow up warranted. Upper normal mediastinal lymph nodes are larger than on the prior study but not pathologic by CT size criteria. 3.9 x 2.3 cm subtle focus of increased attenuation/enhancement in the subcapsular left liver along the gallbladder fossa. This is nonspecific and may be related to focal fatty  sparing. MRI recommended Stable appearance of mixed density lesion in the manubrium and right posterior second rib/vertebral body. 3,3 x 2.2 cm soft tissue structure in the lower right para midline neck has been incompletely visualized. This is presumably the patients right submandibular gland although projects lower than typically seen.    Allergies:   Allergies  Allergen Reactions   Atorvastatin Other (See Comments)    Other reaction(s): Myalgias (intolerance)  08/07/2021: prob not related    Current Medications: Current Outpatient Medications  Medication Sig Dispense Refill   FARXIGA 10 MG TABS tablet Take by mouth.     metFORMIN (GLUCOPHAGE) 1000 MG tablet Take by mouth.     morphine (MSIR) 15 MG tablet      pregabalin (LYRICA) 150 MG capsule Take 150 mg by mouth every 12 (twelve) hours.     rosuvastatin (CRESTOR) 40 MG tablet Take by mouth.     aspirin EC 81 MG tablet Take 81 mg by mouth daily.     buPROPion (WELLBUTRIN XL) 150 MG 24 hr tablet Take 150 mg by mouth every morning.     carvedilol (COREG) 3.125 MG tablet Take 3.125 mg by mouth 2 (two) times daily with a meal.      Dulaglutide 4.5 MG/0.5ML SOPN Inject into the skin.     ezetimibe (ZETIA) 10 MG tablet TAKE 1 TABLET BY MOUTH DAILY FOR CHOLESTEROL     FLUoxetine (PROZAC) 20 MG capsule Take 40 mg by mouth 2 (two) times daily.     furosemide (LASIX) 20 MG tablet Take 20 mg by mouth.     OLANZapine (ZYPREXA) 10 MG tablet Take 10 mg by mouth at bedtime.     spironolactone (ALDACTONE) 25 MG tablet Take 25 mg by mouth daily.     No current facility-administered medications for this visit.     ASSESSMENT &  PLAN:   Assessment:   1. 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     2. Stage I renal cell carcinoma January 2012 treated with a partial left nephrectomy.  He has not had evidence of recurrence.  3. 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 CEA was normal.  This appears to be more consistent with a squamous cell type.  It was confirmed to be an isolated lesion and he completed stereotactic radiation at [facility] in [place] in December 2020.    4. 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  5. Severe COPD.  He did have an episode of respiratory failure in November of 2016 requiring a temp tracheostomy.  6. Deep venous thrombosis in August of 2017 with associated pulmonary emboli.  7. Depression and bipolar disease.  8. Severe insulin-dependent diabetes mellitus, well controlled at this time.    9. History of substance abuse.  10. Surgery for incarcerated umbilical hernia in 2012.    Plan: CT chest scan reveals an area in the right lower lobe about 9 mm which may be consistent with congestion. We will repeat scans again in 3 months. There is a subtle focus of increased attenuation in the subcapsular left liver and gallbladder that may be fatty liver and an MRI of the liver is recommended for further evaluation. It also revealed swelling in his right neck. We will scheudle him an MRI scan  of his liver now. We will plan to see him back in 3 months with CBC, CMP, CEA, and CT chest for reevaluation.  The patient understands the plans discussed today and is in agreement with them.  He knows to contact our office if he develops concerns prior to his next appointment.   I provided 25 minutes of face-to-face time during this this encounter and > 50% was spent  counseling as documented under my assessment and plan.    Dellia Beckwith, MD [facility] [address] [place] [address] Dept: [phone] Dept Fax: [phone]    Avelina Laine as a scribe for Dellia Beckwith, MD.,have documented all relevant documentation on the behalf of Dellia Beckwith, MD,as directed by  Dellia Beckwith, MD while in the presence of Dellia Beckwith, MD.

## 2022-06-11 ENCOUNTER — Encounter: Payer: Self-pay | Admitting: Oncology

## 2022-06-26 ENCOUNTER — Telehealth: Payer: Self-pay

## 2022-06-26 NOTE — Telephone Encounter (Signed)
Jeremy Logan called to ask about the results of the MRI of liver.

## 2022-07-01 ENCOUNTER — Encounter: Payer: Self-pay | Admitting: Hematology and Oncology

## 2022-07-28 ENCOUNTER — Other Ambulatory Visit: Payer: Self-pay

## 2022-07-28 ENCOUNTER — Inpatient Hospital Stay (HOSPITAL_COMMUNITY)
Admission: EM | Admit: 2022-07-28 | Discharge: 2022-07-31 | DRG: 871 | Disposition: A | Discharge status: Home / Self Care | Payer: Medicare Other | Attending: Family Medicine | Admitting: Family Medicine

## 2022-07-28 ENCOUNTER — Emergency Department (HOSPITAL_COMMUNITY): Payer: Medicare Other

## 2022-07-28 ENCOUNTER — Encounter (HOSPITAL_COMMUNITY): Payer: Self-pay | Admitting: Internal Medicine

## 2022-07-28 DIAGNOSIS — G9341 Metabolic encephalopathy: Secondary | ICD-10-CM | POA: Diagnosis present

## 2022-07-28 DIAGNOSIS — C3410 Malignant neoplasm of upper lobe, unspecified bronchus or lung: Secondary | ICD-10-CM | POA: Diagnosis present

## 2022-07-28 DIAGNOSIS — F319 Bipolar disorder, unspecified: Secondary | ICD-10-CM | POA: Diagnosis present

## 2022-07-28 DIAGNOSIS — E785 Hyperlipidemia, unspecified: Secondary | ICD-10-CM | POA: Diagnosis present

## 2022-07-28 DIAGNOSIS — G894 Chronic pain syndrome: Secondary | ICD-10-CM | POA: Diagnosis present

## 2022-07-28 DIAGNOSIS — R471 Dysarthria and anarthria: Secondary | ICD-10-CM | POA: Diagnosis present

## 2022-07-28 DIAGNOSIS — J9601 Acute respiratory failure with hypoxia: Secondary | ICD-10-CM | POA: Diagnosis present

## 2022-07-28 DIAGNOSIS — R0902 Hypoxemia: Secondary | ICD-10-CM

## 2022-07-28 DIAGNOSIS — J440 Chronic obstructive pulmonary disease with acute lower respiratory infection: Secondary | ICD-10-CM | POA: Diagnosis present

## 2022-07-28 DIAGNOSIS — I10 Essential (primary) hypertension: Secondary | ICD-10-CM

## 2022-07-28 DIAGNOSIS — E669 Obesity, unspecified: Secondary | ICD-10-CM | POA: Diagnosis present

## 2022-07-28 DIAGNOSIS — R410 Disorientation, unspecified: Secondary | ICD-10-CM

## 2022-07-28 DIAGNOSIS — I824Y2 Acute embolism and thrombosis of unspecified deep veins of left proximal lower extremity: Secondary | ICD-10-CM | POA: Diagnosis present

## 2022-07-28 DIAGNOSIS — Z905 Acquired absence of kidney: Secondary | ICD-10-CM | POA: Diagnosis not present

## 2022-07-28 DIAGNOSIS — J189 Pneumonia, unspecified organism: Secondary | ICD-10-CM | POA: Diagnosis present

## 2022-07-28 DIAGNOSIS — Z7984 Long term (current) use of oral hypoglycemic drugs: Secondary | ICD-10-CM | POA: Diagnosis not present

## 2022-07-28 DIAGNOSIS — Z888 Allergy status to other drugs, medicaments and biological substances status: Secondary | ICD-10-CM

## 2022-07-28 DIAGNOSIS — Z85528 Personal history of other malignant neoplasm of kidney: Secondary | ICD-10-CM | POA: Diagnosis not present

## 2022-07-28 DIAGNOSIS — Z86718 Personal history of other venous thrombosis and embolism: Secondary | ICD-10-CM | POA: Diagnosis not present

## 2022-07-28 DIAGNOSIS — C642 Malignant neoplasm of left kidney, except renal pelvis: Secondary | ICD-10-CM

## 2022-07-28 DIAGNOSIS — E1169 Type 2 diabetes mellitus with other specified complication: Secondary | ICD-10-CM

## 2022-07-28 DIAGNOSIS — I110 Hypertensive heart disease with heart failure: Secondary | ICD-10-CM | POA: Diagnosis present

## 2022-07-28 DIAGNOSIS — Z86711 Personal history of pulmonary embolism: Secondary | ICD-10-CM

## 2022-07-28 DIAGNOSIS — I5032 Chronic diastolic (congestive) heart failure: Secondary | ICD-10-CM | POA: Diagnosis not present

## 2022-07-28 DIAGNOSIS — Z7982 Long term (current) use of aspirin: Secondary | ICD-10-CM

## 2022-07-28 DIAGNOSIS — Z794 Long term (current) use of insulin: Secondary | ICD-10-CM | POA: Diagnosis not present

## 2022-07-28 DIAGNOSIS — Z6836 Body mass index (BMI) 36.0-36.9, adult: Secondary | ICD-10-CM

## 2022-07-28 DIAGNOSIS — A419 Sepsis, unspecified organism: Principal | ICD-10-CM | POA: Diagnosis present

## 2022-07-28 DIAGNOSIS — R6520 Severe sepsis without septic shock: Secondary | ICD-10-CM | POA: Diagnosis present

## 2022-07-28 DIAGNOSIS — C649 Malignant neoplasm of unspecified kidney, except renal pelvis: Secondary | ICD-10-CM | POA: Diagnosis present

## 2022-07-28 DIAGNOSIS — Z79899 Other long term (current) drug therapy: Secondary | ICD-10-CM

## 2022-07-28 DIAGNOSIS — E66811 Obesity, class 1: Secondary | ICD-10-CM | POA: Diagnosis present

## 2022-07-28 DIAGNOSIS — Z87891 Personal history of nicotine dependence: Secondary | ICD-10-CM | POA: Diagnosis not present

## 2022-07-28 DIAGNOSIS — Z85118 Personal history of other malignant neoplasm of bronchus and lung: Secondary | ICD-10-CM

## 2022-07-28 DIAGNOSIS — R29704 NIHSS score 4: Secondary | ICD-10-CM | POA: Diagnosis present

## 2022-07-28 DIAGNOSIS — G8194 Hemiplegia, unspecified affecting left nondominant side: Secondary | ICD-10-CM | POA: Diagnosis present

## 2022-07-28 LAB — CBC
HCT: 40.8 % (ref 39.0–52.0)
HCT: 42.3 % (ref 39.0–52.0)
Hemoglobin: 13.1 g/dL (ref 13.0–17.0)
Hemoglobin: 13.6 g/dL (ref 13.0–17.0)
MCH: 29.4 pg (ref 26.0–34.0)
MCH: 29.2 pg (ref 26.0–34.0)
MCHC: 32.1 g/dL (ref 30.0–36.0)
MCHC: 32.2 g/dL (ref 30.0–36.0)
MCV: 91.7 fL (ref 80.0–100.0)
MCV: 90.8 fL (ref 80.0–100.0)
Platelets: 230 10*3/uL (ref 150–400)
Platelets: 240 10*3/uL (ref 150–400)
RBC: 4.45 MIL/uL (ref 4.22–5.81)
RBC: 4.66 MIL/uL (ref 4.22–5.81)
RDW: 14.6 % (ref 11.5–15.5)
RDW: 14.7 % (ref 11.5–15.5)
WBC: 16.5 10*3/uL — ABNORMAL HIGH (ref 4.0–10.5)
WBC: 16.9 10*3/uL — ABNORMAL HIGH (ref 4.0–10.5)
nRBC: 0 % (ref 0.0–0.2)
nRBC: 0 % (ref 0.0–0.2)

## 2022-07-28 LAB — COMPREHENSIVE METABOLIC PANEL
ALT: 19 U/L (ref 0–44)
AST: 21 U/L (ref 15–41)
Albumin: 3.4 g/dL — ABNORMAL LOW (ref 3.5–5.0)
Alkaline Phosphatase: 52 U/L (ref 38–126)
Anion gap: 11 (ref 5–15)
BUN: 20 mg/dL (ref 8–23)
CO2: 22 mmol/L (ref 22–32)
Calcium: 8.7 mg/dL — ABNORMAL LOW (ref 8.9–10.3)
Chloride: 102 mmol/L (ref 98–111)
Creatinine, Ser: 1.2 mg/dL (ref 0.61–1.24)
GFR, Estimated: >60 mL/min (ref >60)
Glucose, Bld: 161 mg/dL — ABNORMAL HIGH (ref 70–99)
Potassium: 4 mmol/L (ref 3.5–5.1)
Sodium: 135 mmol/L (ref 135–145)
Total Bilirubin: 0.5 mg/dL (ref 0.3–1.2)
Total Protein: 6.7 g/dL (ref 6.5–8.1)

## 2022-07-28 LAB — I-STAT CHEM 8, ED
BUN: 22 mg/dL (ref 8–23)
Calcium, Ion: 1.09 mmol/L — ABNORMAL LOW (ref 1.15–1.40)
Chloride: 102 mmol/L (ref 98–111)
Creatinine, Ser: 1.3 mg/dL — ABNORMAL HIGH (ref 0.61–1.24)
Glucose, Bld: 157 mg/dL — ABNORMAL HIGH (ref 70–99)
HCT: 41 % (ref 39.0–52.0)
Hemoglobin: 13.9 g/dL (ref 13.0–17.0)
Potassium: 4.1 mmol/L (ref 3.5–5.1)
Sodium: 137 mmol/L (ref 135–145)
TCO2: 25 mmol/L (ref 22–32)

## 2022-07-28 LAB — I-STAT VENOUS BLOOD GAS, ED
Acid-base deficit: 1 mmol/L (ref 0.0–2.0)
Bicarbonate: 25.2 mmol/L (ref 20.0–28.0)
Calcium, Ion: 1.08 mmol/L — ABNORMAL LOW (ref 1.15–1.40)
HCT: 41 % (ref 39.0–52.0)
Hemoglobin: 13.9 g/dL (ref 13.0–17.0)
O2 Saturation: 82 %
Potassium: 4.1 mmol/L (ref 3.5–5.1)
Sodium: 136 mmol/L (ref 135–145)
TCO2: 27 mmol/L (ref 22–32)
pCO2, Ven: 45.6 mmHg (ref 44–60)
pH, Ven: 7.35 (ref 7.25–7.43)
pO2, Ven: 49 mmHg — ABNORMAL HIGH (ref 32–45)

## 2022-07-28 LAB — ETHANOL: Alcohol, Ethyl (B): <10 mg/dL (ref <10)

## 2022-07-28 LAB — DIFFERENTIAL
Abs Immature Granulocytes: 0.07 10*3/uL (ref 0.00–0.07)
Basophils Absolute: 0 10*3/uL (ref 0.0–0.1)
Basophils Relative: 0 %
Eosinophils Absolute: 0.1 10*3/uL (ref 0.0–0.5)
Eosinophils Relative: 0 %
Immature Granulocytes: 0 %
Lymphocytes Relative: 8 %
Lymphs Abs: 1.4 10*3/uL (ref 0.7–4.0)
Monocytes Absolute: 1.3 10*3/uL — ABNORMAL HIGH (ref 0.1–1.0)
Monocytes Relative: 8 %
Neutro Abs: 13.6 10*3/uL — ABNORMAL HIGH (ref 1.7–7.7)
Neutrophils Relative %: 84 %

## 2022-07-28 LAB — TROPONIN I (HIGH SENSITIVITY)
Troponin I (High Sensitivity): 6 ng/L (ref <18)
Troponin I (High Sensitivity): 5 ng/L (ref <18)

## 2022-07-28 LAB — LACTIC ACID, PLASMA
Lactic Acid, Venous: 1.8 mmol/L (ref 0.5–1.9)
Lactic Acid, Venous: 1.7 mmol/L (ref 0.5–1.9)

## 2022-07-28 LAB — GLUCOSE, CAPILLARY: Glucose-Capillary: 170 mg/dL — ABNORMAL HIGH (ref 70–99)

## 2022-07-28 LAB — PROTIME-INR
INR: 1.2 (ref 0.8–1.2)
Prothrombin Time: 15.7 seconds — ABNORMAL HIGH (ref 11.4–15.2)

## 2022-07-28 LAB — HEMOGLOBIN A1C
Hgb A1c MFr Bld: 6.1 % — ABNORMAL HIGH (ref 4.8–5.6)
Mean Plasma Glucose: 128.37 mg/dL

## 2022-07-28 LAB — HIV ANTIBODY (ROUTINE TESTING W REFLEX): HIV Screen 4th Generation wRfx: NONREACTIVE

## 2022-07-28 LAB — CREATININE, SERUM
Creatinine, Ser: 1.02 mg/dL (ref 0.61–1.24)
GFR, Estimated: >60 mL/min (ref >60)

## 2022-07-28 LAB — CBG MONITORING, ED: Glucose-Capillary: 149 mg/dL — ABNORMAL HIGH (ref 70–99)

## 2022-07-28 LAB — APTT: aPTT: 28 seconds (ref 24–36)

## 2022-07-28 MED ORDER — SODIUM CHLORIDE 0.9 % IV SOLN
2.0000 g | INTRAVENOUS | Status: DC
  Administered 2022-07-29 – 2022-07-30 (×2): 2 g via INTRAVENOUS
  Filled 2022-07-28 (×2): qty 20

## 2022-07-28 MED ORDER — ROSUVASTATIN CALCIUM 20 MG PO TABS
20.0000 mg | ORAL_TABLET | Freq: Every day | ORAL | Status: DC
  Administered 2022-07-29 – 2022-07-31 (×3): 20 mg via ORAL
  Filled 2022-07-28 (×3): qty 1

## 2022-07-28 MED ORDER — ACETAMINOPHEN 650 MG RE SUPP: 650.0000 mg | Freq: Four times a day (QID) | RECTAL | Status: DC | PRN

## 2022-07-28 MED ORDER — AZITHROMYCIN 500 MG PO TABS
500.0000 mg | ORAL_TABLET | Freq: Every day | ORAL | Status: DC
  Administered 2022-07-29 – 2022-07-30 (×2): 500 mg via ORAL
  Filled 2022-07-28 (×4): qty 1

## 2022-07-28 MED ORDER — ASPIRIN 81 MG PO TBEC
81.0000 mg | DELAYED_RELEASE_TABLET | Freq: Every day | ORAL | Status: DC
  Administered 2022-07-29 – 2022-07-31 (×3): 81 mg via ORAL
  Filled 2022-07-28 (×3): qty 1

## 2022-07-28 MED ORDER — SODIUM CHLORIDE 0.9 % IV SOLN
500.0000 mg | Freq: Once | INTRAVENOUS | Status: AC
  Administered 2022-07-28: 500 mg via INTRAVENOUS
  Filled 2022-07-28: qty 5

## 2022-07-28 MED ORDER — SODIUM CHLORIDE 0.9 % IV SOLN
1.0000 g | Freq: Once | INTRAVENOUS | Status: AC
  Administered 2022-07-28: 1 g via INTRAVENOUS
  Filled 2022-07-28: qty 10

## 2022-07-28 MED ORDER — FLUOXETINE HCL 20 MG PO CAPS
40.0000 mg | ORAL_CAPSULE | Freq: Two times a day (BID) | ORAL | Status: DC
  Administered 2022-07-28 – 2022-07-31 (×6): 40 mg via ORAL
  Filled 2022-07-28 (×6): qty 2

## 2022-07-28 MED ORDER — ENOXAPARIN SODIUM 40 MG/0.4ML IJ SOSY
40.0000 mg | PREFILLED_SYRINGE | INTRAMUSCULAR | Status: DC
  Administered 2022-07-28 – 2022-07-29 (×2): 40 mg via SUBCUTANEOUS
  Filled 2022-07-28 (×2): qty 0.4

## 2022-07-28 MED ORDER — EZETIMIBE 10 MG PO TABS
10.0000 mg | ORAL_TABLET | Freq: Every day | ORAL | Status: DC
  Administered 2022-07-29 – 2022-07-31 (×3): 10 mg via ORAL
  Filled 2022-07-28 (×3): qty 1

## 2022-07-28 MED ORDER — CARVEDILOL 3.125 MG PO TABS
3.1250 mg | ORAL_TABLET | Freq: Two times a day (BID) | ORAL | Status: DC
  Administered 2022-07-29 – 2022-07-31 (×5): 3.125 mg via ORAL
  Filled 2022-07-28 (×5): qty 1

## 2022-07-28 MED ORDER — OLANZAPINE 10 MG PO TABS
10.0000 mg | ORAL_TABLET | Freq: Every day | ORAL | Status: DC
  Filled 2022-07-28: qty 1

## 2022-07-28 MED ORDER — IPRATROPIUM-ALBUTEROL 0.5-2.5 (3) MG/3ML IN SOLN: 3.0000 mL | RESPIRATORY_TRACT | Status: DC | PRN

## 2022-07-28 MED ORDER — PNEUMOCOCCAL 20-VAL CONJ VACC 0.5 ML IM SUSY
0.5000 mL | PREFILLED_SYRINGE | INTRAMUSCULAR | Status: DC
  Filled 2022-07-28: qty 0.5

## 2022-07-28 MED ORDER — INSULIN ASPART 100 UNIT/ML IJ SOLN
0.0000 [IU] | Freq: Three times a day (TID) | INTRAMUSCULAR | Status: DC
  Administered 2022-07-29: 5 [IU] via SUBCUTANEOUS
  Administered 2022-07-30: 2 [IU] via SUBCUTANEOUS
  Administered 2022-07-30: 3 [IU] via SUBCUTANEOUS
  Administered 2022-07-31: 5 [IU] via SUBCUTANEOUS

## 2022-07-28 MED ORDER — PREGABALIN 75 MG PO CAPS
150.0000 mg | ORAL_CAPSULE | Freq: Two times a day (BID) | ORAL | Status: DC
  Administered 2022-07-28 – 2022-07-31 (×6): 150 mg via ORAL
  Filled 2022-07-28 (×6): qty 2

## 2022-07-28 MED ORDER — HYDROMORPHONE HCL 2 MG PO TABS
2.0000 mg | ORAL_TABLET | ORAL | Status: AC | PRN
  Administered 2022-07-28 – 2022-07-29 (×2): 4 mg via ORAL
  Filled 2022-07-28 (×2): qty 2

## 2022-07-28 MED ORDER — ACETAMINOPHEN 325 MG PO TABS
650.0000 mg | ORAL_TABLET | Freq: Four times a day (QID) | ORAL | Status: DC | PRN
  Administered 2022-07-29 – 2022-07-31 (×8): 650 mg via ORAL
  Filled 2022-07-28 (×8): qty 2

## 2022-07-28 MED ORDER — OLANZAPINE 5 MG PO TABS
5.0000 mg | ORAL_TABLET | Freq: Every day | ORAL | Status: DC
  Administered 2022-07-28 – 2022-07-30 (×3): 5 mg via ORAL
  Filled 2022-07-28 (×4): qty 1

## 2022-07-28 MED ORDER — IPRATROPIUM-ALBUTEROL 0.5-2.5 (3) MG/3ML IN SOLN
3.0000 mL | Freq: Four times a day (QID) | RESPIRATORY_TRACT | Status: DC
  Administered 2022-07-28 – 2022-07-29 (×5): 3 mL via RESPIRATORY_TRACT
  Filled 2022-07-28 (×5): qty 3

## 2022-07-28 MED ORDER — SODIUM CHLORIDE 0.9% FLUSH
3.0000 mL | Freq: Once | INTRAVENOUS | Status: AC
  Administered 2022-07-28: 3 mL via INTRAVENOUS

## 2022-07-28 MED ORDER — IOHEXOL 350 MG/ML SOLN
100.0000 mL | Freq: Once | INTRAVENOUS | Status: AC | PRN
  Administered 2022-07-28: 100 mL via INTRAVENOUS

## 2022-07-28 MED ORDER — BUPROPION HCL ER (XL) 150 MG PO TB24
150.0000 mg | ORAL_TABLET | Freq: Every morning | ORAL | Status: DC
  Administered 2022-07-29 – 2022-07-31 (×3): 150 mg via ORAL
  Filled 2022-07-28 (×3): qty 1

## 2022-07-28 NOTE — ED Notes (Signed)
ED TO INPATIENT HANDOFF REPORT  ED Nurse Name and Phone #: Feliz Beam [phone]  S Name/Age/Gender Jeremy Logan. 62 y.o. male Room/Bed: 003C/003C  Code Status   Code Status: Full Code  Home/SNF/Other Home Patient oriented to: place, person, time Is this baseline? Yes   Triage Complete: Triage complete  Chief Complaint Pneumonia [J18.9]  Triage Note Pt BIB EMS for left sided weakness, slurred speech, and confusion. Per EMS, LSN was 10am. Pt developed midsternal CP enroute. Pt received 324mg  of aspirin and 300cc of NS.    Allergies Allergies  Allergen Reactions   Atorvastatin Other (See Comments)    Other reaction(s): Myalgias (intolerance)  08/07/2021: prob not related    Level of Care/Admitting Diagnosis ED Disposition     ED Disposition  Admit   Condition  --   Comment  Hospital Area: [facility] [[id number]]  Level of Care: Telemetry Medical [104]  May admit patient to [facility] or [facility] if equivalent level of care is available:: No  Covid Evaluation: Asymptomatic - no recent exposure (last 10 days) testing not required  Diagnosis: Pneumonia [227785]  Admitting Physician: Coralie Keens [[id number]]  Attending Physician: Coralie Keens [[id number]]  Certification:: I certify this patient will need inpatient services for at least 2 midnights  Estimated Length of Stay: 3          B Medical/Surgery History 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   RIGHT SHOULDER AND NECK AFTER DESTRUCTION OF THE T1/T2 VERTEBRAL BODIES   COPD (chronic obstructive pulmonary disease) (HCC)    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/2021   Depression    Diabetes mellitus without complication (HCC)    INSULIN DEPENDENT DM   DVT (deep  venous thrombosis) (HCC) 10/2015   HAD 6 MONTH TX WITH ANTICOAGULANTS   Pulmonary embolism (HCC) 2017   TREATED WITH XARELTO   Renal cell carcinoma (HCC) 03/2010   PARTIAL LEFT NEPHRECTOMY/[facility]   Substance abuse (HCC)    TOBACCO...QUIT SMOKING 2018   Past Surgical History:  Procedure Laterality Date   biopsy of lung     HERNIA REPAIR  2012   UMBILICAL INCARCERATED   PARTIAL LEFT NEPHECTOMY  03/2010     A IV Location/Drains/Wounds Patient Lines/Drains/Airways Status     Active Line/Drains/Airways     Name Placement date Placement time Site Days   Implanted Port 01/11/09 Left Chest 01/11/09  1200  Chest  4946   Peripheral IV 07/28/22 18 G Left Antecubital 07/28/22  1553  Antecubital  less than 1   Peripheral IV 07/28/22 18 G Anterior;Left Wrist 07/28/22  1553  Wrist  less than 1            Intake/Output Last 24 hours  Intake/Output Summary (Last 24 hours) at 07/28/2022 1843 Last data filed at 07/28/2022 1737 Gross per 24 hour  Intake 342.85 ml  Output --  Net 342.85 ml    Logan/Imaging Results for orders placed or performed during the hospital encounter of 07/28/22 (from the past 48 hour(s))  CBG monitoring, ED     Status: Abnormal   Collection Time: 07/28/22  3:11 PM  Result Value Ref Range   Glucose-Capillary 149 (H) 70 - 99 mg/dL    Comment: Glucose reference range applies  only to samples taken after fasting for at least 8 hours.  Protime-INR     Status: Abnormal   Collection Time: 07/28/22  3:15 PM  Result Value Ref Range   Prothrombin Time 15.7 (H) 11.4 - 15.2 seconds   INR 1.2 0.8 - 1.2    Comment: (NOTE) INR goal varies based on device and disease states. Performed at [facility], [address], [place], [place] [address]   APTT     Status: None   Collection Time: 07/28/22  3:15 PM  Result Value Ref Range   aPTT 28 24 - 36 seconds    Comment: Performed at [facility], [address], [place], [place] [address]  CBC     Status:  Abnormal   Collection Time: 07/28/22  3:15 PM  Result Value Ref Range   WBC 16.5 (H) 4.0 - 10.5 K/uL   RBC 4.45 4.22 - 5.81 MIL/uL   Hemoglobin 13.1 13.0 - 17.0 g/dL   HCT 09.8 11.9 - 14.7 %   MCV 91.7 80.0 - 100.0 fL   MCH 29.4 26.0 - 34.0 pg   MCHC 32.1 30.0 - 36.0 g/dL   RDW 82.9 56.2 - 13.0 %   Platelets 230 150 - 400 K/uL   nRBC 0.0 0.0 - 0.2 %    Comment: Performed at [facility], [address], [place], [place] [address]  Differential     Status: Abnormal   Collection Time: 07/28/22  3:15 PM  Result Value Ref Range   Neutrophils Relative % 84 %   Neutro Abs 13.6 (H) 1.7 - 7.7 K/uL   Lymphocytes Relative 8 %   Lymphs Abs 1.4 0.7 - 4.0 K/uL   Monocytes Relative 8 %   Monocytes Absolute 1.3 (H) 0.1 - 1.0 K/uL   Eosinophils Relative 0 %   Eosinophils Absolute 0.1 0.0 - 0.5 K/uL   Basophils Relative 0 %   Basophils Absolute 0.0 0.0 - 0.1 K/uL   Immature Granulocytes 0 %   Abs Immature Granulocytes 0.07 0.00 - 0.07 K/uL    Comment: Performed at [facility], [address] [place], [place] [address]  Comprehensive metabolic panel     Status: Abnormal   Collection Time: 07/28/22  3:15 PM  Result Value Ref Range   Sodium 135 135 - 145 mmol/L   Potassium 4.0 3.5 - 5.1 mmol/L   Chloride 102 98 - 111 mmol/L   CO2 22 22 - 32 mmol/L   Glucose, Bld 161 (H) 70 - 99 mg/dL    Comment: Glucose reference range applies only to samples taken after fasting for at least 8 hours.   BUN 20 8 - 23 mg/dL   Creatinine, Ser 9.52 0.61 - 1.24 mg/dL   Calcium 8.7 (L) 8.9 - 10.3 mg/dL   Total Protein 6.7 6.5 - 8.1 g/dL   Albumin 3.4 (L) 3.5 - 5.0 g/dL   AST 21 15 - 41 U/L   ALT 19 0 - 44 U/L   Alkaline Phosphatase 52 38 - 126 U/L   Total Bilirubin 0.5 0.3 - 1.2 mg/dL   GFR, Estimated >84 >13 mL/min    Comment: (NOTE) Calculated using the CKD-EPI Creatinine Equation (2021)    Anion gap 11 5 - 15    Comment: Performed at [facility], [address] [place], [place]  [address]  Ethanol     Status: None   Collection Time: 07/28/22  3:15 PM  Result Value Ref Range   Alcohol, Ethyl (B) <10 <10 mg/dL    Comment: (NOTE) Lowest detectable limit for serum alcohol is 10 mg/dL.  For medical purposes only. Performed at [facility], [address] [place], [place] [address]   I-stat chem 8, ED     Status: Abnormal   Collection Time: 07/28/22  3:20 PM  Result Value Ref Range   Sodium 137 135 - 145 mmol/L   Potassium 4.1 3.5 - 5.1 mmol/L   Chloride 102 98 - 111 mmol/L   BUN 22 8 - 23 mg/dL   Creatinine, Ser 1.30 (H) 0.61 - 1.24 mg/dL   Glucose, Bld 865 (H) 70 - 99 mg/dL    Comment: Glucose reference range applies only to samples taken after fasting for at least 8 hours.   Calcium, Ion 1.09 (L) 1.15 - 1.40 mmol/L   TCO2 25 22 - 32 mmol/L   Hemoglobin 13.9 13.0 - 17.0 g/dL   HCT 78.4 69.6 - 29.5 %  I-Stat venous blood gas, ED     Status: Abnormal   Collection Time: 07/28/22  3:59 PM  Result Value Ref Range   pH, Ven 7.350 7.25 - 7.43   pCO2, Ven 45.6 44 - 60 mmHg   pO2, Ven 49 (H) 32 - 45 mmHg   Bicarbonate 25.2 20.0 - 28.0 mmol/L   TCO2 27 22 - 32 mmol/L   O2 Saturation 82 %   Acid-base deficit 1.0 0.0 - 2.0 mmol/L   Sodium 136 135 - 145 mmol/L   Potassium 4.1 3.5 - 5.1 mmol/L   Calcium, Ion 1.08 (L) 1.15 - 1.40 mmol/L   HCT 41.0 39.0 - 52.0 %   Hemoglobin 13.9 13.0 - 17.0 g/dL   Sample type VENOUS   Troponin I (High Sensitivity)     Status: None   Collection Time: 07/28/22  3:59 PM  Result Value Ref Range   Troponin I (High Sensitivity) 6 <18 ng/L    Comment: (NOTE) Elevated high sensitivity troponin I (hsTnI) values and significant  changes across serial measurements may suggest ACS but many other  chronic and acute conditions are known to elevate hsTnI results.  Refer to the "Links" section for chest pain algorithms and additional  guidance. Performed at [facility], [address] [place], [place] [address]   Lactic acid,  plasma     Status: None   Collection Time: 07/28/22  4:52 PM  Result Value Ref Range   Lactic Acid, Venous 1.8 0.5 - 1.9 mmol/L    Comment: Performed at [facility], [address] [place], [place] [address]   CT ANGIO CHEST AORTA W/CM & OR WO/CM  Result Date: 07/28/2022 CLINICAL DATA:  Chest pain. EXAM: CT ANGIOGRAPHY CHEST WITH CONTRAST TECHNIQUE: Multidetector CT imaging of the chest was performed using the standard protocol during bolus administration of intravenous contrast. Multiplanar CT image reconstructions and MIPs were obtained to evaluate the vascular anatomy. RADIATION DOSE REDUCTION: This exam was performed according to the departmental dose-optimization program which includes automated exposure control, adjustment of the mA and/or kV according to patient size and/or use of iterative reconstruction technique. CONTRAST:  OMNIPAQUE IOHEXOL 350 MG/ML SOLN COMPARISON:  June 02, 2022. FINDINGS: Cardiovascular: Preferential opacification of the thoracic aorta. No evidence of thoracic aortic aneurysm or dissection. Normal heart size. No pericardial effusion. Mediastinum/Nodes: Esophagus is unremarkable. Thyroid gland is unremarkable. 12 mm right paratracheal lymph node is noted which is enlarged compared to  prior exam. Subcarinal adenopathy is noted with minor axis of 15 mm which is significantly enlarged compared to prior exam. Right hilar adenopathy measuring 14 mm is also noted. Lungs/Pleura: No pneumothorax or pleural effusion is noted. Patchy nodular opacities are seen in both lungs, most prominently in the right lower lobe most likely due to pneumonia. Similar opacities are also seen in the left lower lobe and right upper lobe. Stable scarring or fibrosis is noted in the right upper lobe. Upper Abdomen: No acute abnormality. Musculoskeletal: Stable sclerotic density is seen involving T1 and T2 as well as posterior portion of right second rib most likely due to previous radiation  treatment. No acute fracture is noted. Review of the MIP images confirms the above findings. IMPRESSION: No evidence of thoracic aortic dissection or aneurysm. However, there is interval element of large opacity seen involving right lower lobe most consistent with pneumonia. Multiple smaller patchy airspace opacities are noted in the left lower lobe and right upper lobe most likely inflammatory in etiology, suggesting multifocal pneumonia. Enlarged right paratracheal, subcarinal and right hilar lymph nodes are noted which most likely are inflammatory in etiology given the presence of pneumonia, but follow-up CT scan in 3-4 weeks is recommended to ensure resolution or stability. Electronically Signed   By: Lupita Raider M.D.   On: 07/28/2022 15:51   CT HEAD CODE STROKE WO CONTRAST  Result Date: 07/28/2022 CLINICAL DATA:  Code stroke.  Neuro deficit, stroke suspected. EXAM: CT ANGIOGRAPHY HEAD AND NECK TECHNIQUE: Multidetector CT imaging of the head and neck was performed using the standard protocol during bolus administration of intravenous contrast. Multiplanar CT image reconstructions and MIPs were obtained to evaluate the vascular anatomy. Carotid stenosis measurements (when applicable) are obtained utilizing NASCET criteria, using the distal internal carotid diameter as the denominator. RADIATION DOSE REDUCTION: This exam was performed according to the departmental dose-optimization program which includes automated exposure control, adjustment of the mA and/or kV according to patient size and/or use of iterative reconstruction technique. CONTRAST:  OMNIPAQUE IOHEXOL 350 MG/ML SOLN COMPARISON:  CT head 10/21/2015 FINDINGS: CT HEAD FINDINGS Brain: There is no acute intracranial hemorrhage, extra-axial fluid collection, or acute infarct. Parenchymal volume is normal. The ventricles are normal in size. Gray-white differentiation is preserved. The pituitary and suprasellar region are normal. There is no  mass lesion. There is no mass effect or midline shift. Vascular: No hyperdense vessel or unexpected calcification. Skull: Normal. Negative for fracture or focal lesion. Sinuses/Orbits: The paranasal sinuses are clear. The globes and orbits are unremarkable. Other: None. ASPECTS [facility] Stroke Program Early CT Score) - Ganglionic level infarction (caudate, lentiform nuclei, internal capsule, insula, M1-M3 cortex): 7 - Supraganglionic infarction (M4-M6 cortex): 3 Total score (0-10 with 10 being normal): 10 CTA NECK FINDINGS Aortic arch: The imaged aortic arch is normal. The origins of the major branch vessels are patent. The subclavian arteries are patent to the level imaged. Right carotid system: The right common, internal, and external carotid arteries are patent, with mild plaque of the bifurcation but no hemodynamically significant stenosis or occlusion there is no evidence of dissection or aneurysm. Left carotid system: The left common, internal, and external carotid arteries are patent, with mild plaque of the bifurcation but no hemodynamically significant stenosis or occlusion. There is no evidence of dissection or aneurysm. Vertebral arteries: The vertebral arteries are patent, without hemodynamically significant stenosis or occlusion. There is no evidence of dissection or aneurysm. Skeleton: There is no acute osseous abnormality or suspicious  osseous lesion. There is no visible canal hematoma. Other neck: The soft tissues of the neck are unremarkable. Upper chest: Assessed on the separately dictated CT chest. Review of the MIP images confirms the above findings CTA HEAD FINDINGS Anterior circulation: There is mild calcified plaque in the intracranial ICAs without significant stenosis or occlusion. The bilateral MCAs are patent, without proximal stenosis or occlusion. The bilateral ACAs are patent, without proximal stenosis or occlusion. The anterior communicating artery is normal. There is no aneurysm or  AVM. Posterior circulation: The bilateral V4 segments are patent. The basilar artery is patent. The major cerebellar arteries appear patent. The bilateral PCAs are patent, without proximal stenosis or occlusion. Posterior communicating arteries are not identified. There is no aneurysm or AVM. Venous sinuses: Patent. Anatomic variants: None. Review of the MIP images confirms the above findings IMPRESSION: 1. No acute intracranial pathology. 2. Patent vasculature of the head and neck with no hemodynamically significant stenosis, occlusion, or dissection. Findings communicated to Dr. Amada Jupiter via Loretha Stapler at 3:25 pm. Electronically Signed   By: Lesia Hausen M.D.   On: 07/28/2022 15:39   CT ANGIO HEAD NECK W WO CM (CODE STROKE)  Result Date: 07/28/2022 CLINICAL DATA:  Code stroke.  Neuro deficit, stroke suspected. EXAM: CT ANGIOGRAPHY HEAD AND NECK TECHNIQUE: Multidetector CT imaging of the head and neck was performed using the standard protocol during bolus administration of intravenous contrast. Multiplanar CT image reconstructions and MIPs were obtained to evaluate the vascular anatomy. Carotid stenosis measurements (when applicable) are obtained utilizing NASCET criteria, using the distal internal carotid diameter as the denominator. RADIATION DOSE REDUCTION: This exam was performed according to the departmental dose-optimization program which includes automated exposure control, adjustment of the mA and/or kV according to patient size and/or use of iterative reconstruction technique. CONTRAST:  OMNIPAQUE IOHEXOL 350 MG/ML SOLN COMPARISON:  CT head 10/21/2015 FINDINGS: CT HEAD FINDINGS Brain: There is no acute intracranial hemorrhage, extra-axial fluid collection, or acute infarct. Parenchymal volume is normal. The ventricles are normal in size. Gray-white differentiation is preserved. The pituitary and suprasellar region are normal. There is no mass lesion. There is no mass effect or midline shift.  Vascular: No hyperdense vessel or unexpected calcification. Skull: Normal. Negative for fracture or focal lesion. Sinuses/Orbits: The paranasal sinuses are clear. The globes and orbits are unremarkable. Other: None. ASPECTS [facility] Stroke Program Early CT Score) - Ganglionic level infarction (caudate, lentiform nuclei, internal capsule, insula, M1-M3 cortex): 7 - Supraganglionic infarction (M4-M6 cortex): 3 Total score (0-10 with 10 being normal): 10 CTA NECK FINDINGS Aortic arch: The imaged aortic arch is normal. The origins of the major branch vessels are patent. The subclavian arteries are patent to the level imaged. Right carotid system: The right common, internal, and external carotid arteries are patent, with mild plaque of the bifurcation but no hemodynamically significant stenosis or occlusion there is no evidence of dissection or aneurysm. Left carotid system: The left common, internal, and external carotid arteries are patent, with mild plaque of the bifurcation but no hemodynamically significant stenosis or occlusion. There is no evidence of dissection or aneurysm. Vertebral arteries: The vertebral arteries are patent, without hemodynamically significant stenosis or occlusion. There is no evidence of dissection or aneurysm. Skeleton: There is no acute osseous abnormality or suspicious osseous lesion. There is no visible canal hematoma. Other neck: The soft tissues of the neck are unremarkable. Upper chest: Assessed on the separately dictated CT chest. Review of the MIP images confirms the above findings CTA  HEAD FINDINGS Anterior circulation: There is mild calcified plaque in the intracranial ICAs without significant stenosis or occlusion. The bilateral MCAs are patent, without proximal stenosis or occlusion. The bilateral ACAs are patent, without proximal stenosis or occlusion. The anterior communicating artery is normal. There is no aneurysm or AVM. Posterior circulation: The bilateral V4 segments are  patent. The basilar artery is patent. The major cerebellar arteries appear patent. The bilateral PCAs are patent, without proximal stenosis or occlusion. Posterior communicating arteries are not identified. There is no aneurysm or AVM. Venous sinuses: Patent. Anatomic variants: None. Review of the MIP images confirms the above findings IMPRESSION: 1. No acute intracranial pathology. 2. Patent vasculature of the head and neck with no hemodynamically significant stenosis, occlusion, or dissection. Findings communicated to Dr. Amada Jupiter via Loretha Stapler at 3:25 pm. Electronically Signed   By: Lesia Hausen M.D.   On: 07/28/2022 15:39    Pending Logan Unresulted Logan (From admission, onward)     Start     Ordered   07/28/22 1555  Culture, blood (routine x 2)  BLOOD CULTURE X 2,   R      07/28/22 1555   07/28/22 1555  Lactic acid, plasma  Now then every 2 hours,   R      07/28/22 1555   Signed and Held  HIV Antibody (routine testing w rflx)  (HIV Antibody (Routine testing w reflex) panel)  Once,   R        Signed and Held   Signed and Held  CBC  (enoxaparin (LOVENOX)    CrCl >/= 30 ml/min)  Once,   R       Comments: Baseline for enoxaparin therapy IF NOT ALREADY DRAWN.  Notify MD if PLT < 100 K.    Signed and Held   Signed and Held  Creatinine, serum  (enoxaparin (LOVENOX)    CrCl >/= 30 ml/min)  Once,   R       Comments: Baseline for enoxaparin therapy IF NOT ALREADY DRAWN.    Signed and Held   Signed and Held  Creatinine, serum  (enoxaparin (LOVENOX)    CrCl >/= 30 ml/min)  Weekly,   R     Comments: while on enoxaparin therapy    Signed and Held   Signed and Held  Basic metabolic panel  Tomorrow morning,   R        Signed and Held   Signed and Held  CBC  Tomorrow morning,   R        Signed and Held            Vitals/Pain Today's Vitals   07/28/22 1645 07/28/22 1700 07/28/22 1730 07/28/22 1741  BP: 93/60 92/60 (!) 88/62 107/78  Pulse: 92 89 91 92  Resp: 20 16 18 17   Temp:    98 F (36.7  C)  TempSrc:    Oral  SpO2: 92% 92% 92% 95%  Weight:      PainSc:    8     Isolation Precautions No active isolations  Medications Medications  sodium chloride flush (NS) 0.9 % injection 3 mL (3 mLs Intravenous Given 07/28/22 1634)  iohexol (OMNIPAQUE) 350 MG/ML injection 100 mL (100 mLs Intravenous Contrast Given 07/28/22 1532)  cefTRIAXone (ROCEPHIN) 1 g in sodium chloride 0.9 % 100 mL IVPB (0 g Intravenous Stopped 07/28/22 1705)  azithromycin (ZITHROMAX) 500 mg in sodium chloride 0.9 % 250 mL IVPB (0 mg Intravenous Stopped 07/28/22 1737)    Mobility  walks        R Recommendations: See Admitting Provider Note  Report given to:   Additional Notes:

## 2022-07-28 NOTE — H&P (Signed)
History and Physical    Patient: Jeremy Logan. JWJ:[id number] DOB: December 12, 1960 DOA: 07/28/2022 DOS: the patient was seen and examined on 07/28/2022 PCP: Olive Bass, MD  Patient coming from: Home  Chief Complaint:  Chief Complaint  Patient presents with   Code Stroke   HPI: Jeremy Logan. is a 62 y.o. male with medical history significant of lung and renal cancer, T2DM, COPD, DVT/PE, obesity and bipolar disorder who presented with altered mental status.  He reports having worsening dyspnea for the last 4 weeks, associated with congestion but not cough. His symptoms became very severe last night, positive subjective fever and not able to sleep due to dyspnea.  This morning his friend who lives with him noted patient having dysarthria and confusion, more weak on the left side. EMS was called and patient was found hypoxic, he was placed on supplemental 02 per non rebreather and was transported to the ED.  Code stroke was called in the ED. Per neurology evaluation no acute CVA.   Patient has been compliant with his medications. No sick contacts. Has not been taking any over the counter medications. He ambulates independently.    Review of Systems: As mentioned in the history of present illness. All other systems reviewed and are negative. 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   RIGHT SHOULDER AND NECK AFTER DESTRUCTION OF THE T1/T2 VERTEBRAL BODIES   COPD (chronic obstructive pulmonary disease) (HCC)    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/2021   Depression    Diabetes mellitus without complication (HCC)    INSULIN DEPENDENT DM   DVT (deep venous thrombosis) (HCC) 10/2015   HAD 6 MONTH TX WITH ANTICOAGULANTS   Pulmonary embolism (HCC) 2017   TREATED WITH  XARELTO   Renal cell carcinoma (HCC) 03/2010   PARTIAL LEFT NEPHRECTOMY/[facility]   Substance abuse (HCC)    TOBACCO...QUIT SMOKING 2018   Past Surgical History:  Procedure Laterality Date   biopsy of lung     HERNIA REPAIR  2012   UMBILICAL INCARCERATED   PARTIAL LEFT NEPHECTOMY  03/2010   Social History:  reports that he quit smoking about 7 years ago. His smoking use included cigarettes. He has never used smokeless tobacco. No history on file for alcohol use and drug use.  Allergies  Allergen Reactions   Atorvastatin Other (See Comments)    Other reaction(s): Myalgias (intolerance)  08/07/2021: prob not related    No family history on file.  Prior to Admission medications   Medication Sig Start Date End Date Taking? Authorizing Provider  aspirin EC 81 MG tablet Take 81 mg by mouth daily.    [provider]  buPROPion (WELLBUTRIN XL) 150 MG 24 hr tablet Take 150 mg by mouth every morning. 08/25/21   [provider]  carvedilol (COREG) 3.125 MG tablet Take 3.125 mg by mouth 2 (two) times daily with a meal.  04/05/15   [provider]  Dulaglutide 4.5 MG/0.5ML SOPN Inject into the skin. 08/04/21   [provider]  ezetimibe (ZETIA) 10 MG tablet TAKE 1 TABLET BY MOUTH DAILY FOR CHOLESTEROL 01/05/20   [provider]  FARXIGA 10 MG TABS tablet Take by mouth. 02/10/22   [provider]  FLUoxetine (PROZAC) 20 MG capsule  Take 40 mg by mouth 2 (two) times daily. 11/18/18   [provider]  furosemide (LASIX) 20 MG tablet Take 20 mg by mouth.    [provider]  metFORMIN (GLUCOPHAGE) 1000 MG tablet Take by mouth. 02/10/22   [provider]  morphine (MSIR) 15 MG tablet  01/08/22   [provider]  OLANZapine (ZYPREXA) 10 MG tablet Take 10 mg by mouth at bedtime. 05/30/20   [provider]  pregabalin (LYRICA) 150 MG capsule Take 150 mg by mouth every 12 (twelve) hours. 05/17/22   [provider]  rosuvastatin (CRESTOR) 40 MG tablet Take by mouth. 08/07/21   [provider]  spironolactone (ALDACTONE) 25 MG tablet Take 25 mg by mouth daily.    [provider]    Physical Exam: Vitals:   07/28/22 1645 07/28/22 1700 07/28/22 1730 07/28/22 1741  BP: 93/60 92/60 (!) 88/62 107/78  Pulse: 92 89 91 92  Resp: 20 16 18 17   Temp:    98 F (36.7 C)  TempSrc:    Oral  SpO2: 92% 92% 92% 95%  Weight:       Neurology awake and alert, no confusion or agitation, his coordination and strength are preserved ENT with mild pallor Cardiovascular with S1 and S2 present and rhythmic with no gallops, rubs or murmurs No JVD Trace lower extremity edema bilaterally, + pitting Respiratory with rhonchi bilaterally with no wheezing or rales. No increased work of breathing or use of accessory muscles.  Abdomen with no distention   Data Reviewed:   VBG 7,35/ 45,6/ 49/ 27/ 82% Na 135, K 4,0 CL 102 bicarbonate 22, glucose 161, bun 20 cr 1,20  AST 21, ALT 19 High sensitive troponin 6 Lactic acid 1,8  Wbc 16,5 hgb 13.1 plt 230 Alcohol level <10  CT chest with no evidence of thoracic aortic dissection or aneurysm. Positive patchy infiltrate right lower lobe. Enlarged right paratracheal, subcarinal and right hilar lymph nodes.   Head and agio neck CT with no acute changes  EKG 96 bpm, normal axis, qtc 516, sinus rhythm with no significant ST segment or T wave changes.   62 yo male with multiple medical problems, presented with worsening dyspnea and congestion. Positive fever at home. Found with hypoxemic with change in mentation and focal neurologic deficit. Currently hemodynamically stable, positive rhonchi on lung examination. Positive leukocytosis and positive right lower lobe infiltrate.  Dx Right lower lobe community acquired pneumonia complicated with acute hypoxemic respiratory failure.    Assessment and Plan: * Pneumonia Acute hypoxemic respiratory  failure. Severe sepsis present on admission, end organ damage encephalopathy.   Plan to continue antibiotic therapy with IV ceftriaxone and oral azithromycin Add bronchodilator therapy. Incentive spirometer and flutter valve.  Out of bed to chair tid with meals. Continue with supplemental 02 per [place] to keep 02 saturation 92% or greater.  Hold on systemic steroids for now.   Chronic diastolic heart failure (HCC) No clinical signs of acute exacerbation Plan to continue blood pressure control, and hold on diuretic therapy for now.   Essential hypertension Continue blood pressure control with carvedilol and spironolactone.  Hold on diuretic therapy for now.   Type 2 diabetes mellitus with hyperlipidemia (HCC) Continue insulin sliding scale for glucose cover and monitoring  Continue with statin therapy.   Renal cell carcinoma (HCC) Sp nephrectomy, follow up as outpatient.   Cancer of lung, upper lobe (HCC) Follow up as outpatient.   Deep vein thrombosis (DVT) of proximal  vein of left lower extremity (HCC) Currently not on anticoagulation.   Bipolar disease, chronic (HCC) Resume home regimen including olanzapine, pregabalin, bupropion, fluoxetine.   Chronic pain syndrome continue morphine Neuro checks per unit protocol.       Advance Care Planning:   Code Status: Full Code   Consults: none   Family Communication: no family at the bedside   Severity of Illness: The appropriate patient status for this patient is INPATIENT. Inpatient status is judged to be reasonable and necessary in order to provide the required intensity of service to ensure the patient's safety. The patient's presenting symptoms, physical exam findings, and initial radiographic and laboratory data in the context of their chronic comorbidities is felt to place them at high risk for further clinical deterioration. Furthermore, it is not anticipated that the patient will be medically stable for discharge from the  hospital within 2 midnights of admission.   * I certify that at the point of admission it is my clinical judgment that the patient will require inpatient hospital care spanning beyond 2 midnights from the point of admission due to high intensity of service, high risk for further deterioration and high frequency of surveillance required.*  Author: Coralie Keens, MD 07/28/2022 6:16 PM  For on call review [email or web address].

## 2022-07-28 NOTE — Assessment & Plan Note (Signed)
Currently patient not on anticoagulation.  

## 2022-07-28 NOTE — Assessment & Plan Note (Signed)
Sp nephrectomy, follow up as outpatient.

## 2022-07-28 NOTE — Assessment & Plan Note (Addendum)
No clinical signs of acute exacerbation Plan to continue blood pressure control, and hold on diuretic therapy for now.

## 2022-07-28 NOTE — Consult Note (Signed)
Neurology Consultation  Reason for Consult: Code Stroke Referring Physician: Charm Barges  CC: Confusion, left sided weakness  History is obtained from: Patient  HPI: Jeremy Logan. is a 62 y.o. male with a past medical history of lung cancer, diabetes, PE previously on xarelto presenting with left sided weakness, confusion. He states that at 1000 he looked at his watch and started feeling confused. His family noticed that his speech was slurred and he was weak on the left. Upon further conversation with his sister she said that she noticed him confused with slurred speech, leaning to the right in his chair, and drooling. They both received their shingles vaccines yesterday. She put a portable pulse ox on him that read 74% so she called EMS. They put him on a non-re breather and noted his glucose was 336. In route to [facility] he became less confused, but did endorse mid sternal chest pain. On exam NIH is 4 for dysarthria, left sensory deficit, and bilateral lower extremity weakness. Code stroke cancelled, symptoms improved throughout exam and with O2 administration.    LKW: 1000 tpa given?: No, outside of the window Premorbid modified Rankin scale (mRS):  0-Completely asymptomatic and back to baseline post-stroke   ROS: Full ROS was performed and is negative except as noted in the HPI.   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  Social History:   reports that he quit smoking about 7 years ago. His smoking use included cigarettes. He has never used smokeless tobacco. No history on file for alcohol use and drug use.  Medications  Current Facility-Administered Medications:    sodium chloride flush (NS) 0.9 % injection 3 mL, 3 mL, Intravenous, Once, Terrilee Files, MD  Current Outpatient Medications:    aspirin EC 81 MG tablet, Take 81 mg by mouth daily., Disp: , Rfl:    buPROPion (WELLBUTRIN XL) 150 MG 24 hr tablet, Take 150 mg by mouth every morning., Disp: , Rfl:    carvedilol (COREG) 3.125 MG tablet, Take 3.125 mg by mouth 2 (two) times daily with a meal. , Disp: , Rfl:    Dulaglutide 4.5 MG/0.5ML SOPN, Inject into the skin., Disp: , Rfl:    ezetimibe (ZETIA) 10 MG tablet, TAKE 1 TABLET BY MOUTH DAILY FOR CHOLESTEROL, Disp: , Rfl:    FARXIGA 10 MG TABS tablet, Take by mouth., Disp: , Rfl:    FLUoxetine (PROZAC) 20 MG capsule, Take 40 mg by mouth 2 (two) times daily., Disp: , Rfl:    furosemide (LASIX) 20 MG tablet, Take 20 mg by mouth., Disp: , Rfl:    metFORMIN (GLUCOPHAGE) 1000 MG tablet, Take by mouth., Disp: ,  Rfl:    morphine (MSIR) 15 MG tablet, , Disp: , Rfl:    OLANZapine (ZYPREXA) 10 MG tablet, Take 10 mg by mouth at bedtime., Disp: , Rfl:    pregabalin (LYRICA) 150 MG capsule, Take 150 mg by mouth every 12 (twelve) hours., Disp: , Rfl:    rosuvastatin (CRESTOR) 40 MG tablet, Take by mouth., Disp: , Rfl:    spironolactone (ALDACTONE) 25 MG tablet, Take 25 mg by mouth daily., Disp: , Rfl:   Exam: Current vital signs: There were no vitals taken for this visit. Vital signs in last 24 hours:    GENERAL: Awake, alert in NAD HEENT: - Normocephalic and atraumatic, dry mm, no LN++, no  Thyromegally LUNGS - Clear to auscultation bilaterally with no wheezes CV - S1S2 RRR, no m/r/g, equal pulses bilaterally. ABDOMEN - Soft, nontender, nondistended with normoactive BS Ext: warm, well perfused, intact peripheral pulses, no edema  NEURO:  Mental Status: AA&Ox3  Language: speech is mildly dysarthric.  Naming, repetition, fluency, and comprehension intact. Cranial Nerves: PERRL 3 mm/brisk. EOMI, visual fields full, no facial asymmetry, facial sensation intact, hearing intact, tongue/uvula/soft palate midline, normal sternocleidomastoid and trapezius muscle strength. No evidence of tongue atrophy or fasciculations Motor:  RUE 5/5 LUE 5/5 RLE 4/5 with drift LLE 4/5 with drift  Tone: is normal and bulk is normal Sensation- Diminished in the left lower extremity to temperature Coordination: FTN intact bilaterally, no ataxia in BLE. Gait- deferred    NIHSS components Score: Comment  1a Level of Conscious 0[x]  1[]  2[]  3[]         1b LOC Questions 0[x]  1[]  2[]           1c LOC Commands 0[x]  1[]  2[]           2 Best Gaze 0[x]  1[]  2[]           3 Visual 0[x]  1[]  2[]  3[]         4 Facial Palsy 0[x]  1[]  2[]  3[]         5a Motor Arm - left 0[x]  1[]  2[]  3[]  4[]  UN[]     5b Motor Arm - Right 0[x]  1[]  2[]  3[]  4[]  UN[]     6a Motor Leg - Left 0[]  1[x]  2[]  3[]  4[]  UN[]     6b Motor Leg - Right 0[]  1[x]  2[]  3[]  4[]  UN[]     7 Limb Ataxia 0[x]  1[]  2[]  3[]  UN[]       8 Sensory 0[]  1[x]  2[]  UN[]         9 Best Language 0[]  1[x]  2[]  3[]         10 Dysarthria 0[x]  1[]  2[]  UN[]         11 Extinct. and Inattention 0[x]  1[]  2[]           TOTAL: 4        Labs I have reviewed labs in epic and the results pertinent to this consultation are:   CBC    Component Value Date/Time   WBC 10.9 06/02/2022 0000   WBC 18.0 (H) 11/02/2008 0401   RBC 4.67 06/02/2022 0000   HGB 13.8 06/02/2022 0000   HCT 42 06/02/2022 0000   PLT 286 06/02/2022 0000   MCV 88 04/01/2021 0000   MCHC 32.8 11/02/2008 0401   RDW  14.3 11/02/2008 0401   LYMPHSABS 0.7 11/02/2008 0401   MONOABS 0.8 11/02/2008 0401   EOSABS 0.0 11/02/2008 0401   BASOSABS 0.1 11/02/2008 0401    CMP     Component Value Date/Time   NA 139 06/02/2022 0000  K 4.7 06/02/2022 0000   CL 105 06/02/2022 0000   CO2 27 (A) 06/02/2022 0000   GLUCOSE 124 (H) 11/02/2008 0401   BUN 25 (A) 06/02/2022 0000   CREATININE 1.2 06/02/2022 0000   CREATININE 0.82 11/02/2008 0401   CALCIUM 9.1 06/02/2022 0000   PROT 6.6 11/01/2008 0345   ALBUMIN 4.3 06/02/2022 0000   AST 31 06/02/2022 0000   ALT 26 06/02/2022 0000   ALKPHOS 48 06/02/2022 0000   BILITOT 0.5 11/01/2008 0345   GFRNONAA >60 11/02/2008 0401   GFRAA  11/02/2008 0401    >60        The eGFR has been calculated using the MDRD equation. This calculation has not been validated in all clinical situations. eGFR's persistently <60 mL/min signify possible Chronic Kidney Disease.    Lipid Panel  No results found for: "CHOL", "TRIG", "HDL", "CHOLHDL", "VLDL", "LDLCALC", "LDLDIRECT"   Imaging I have reviewed the images obtained:  CT-head CTA Head and Neck  1. No acute intracranial pathology. 2. Patent vasculature of the head and neck with no hemodynamically significant stenosis, occlusion, or dissection.  CTA Aorta No evidence of thoracic aortic dissection or aneurysm. However, there is interval element of large opacity seen involving right lower lobe most consistent with pneumonia. Multiple smaller patchy airspace opacities are noted in the left lower lobe and right upper lobe most likely inflammatory in etiology, suggesting multifocal pneumonia. Enlarged right paratracheal, subcarinal and right hilar lymph nodes are noted which most likely are inflammatory in etiology given the presence of pneumonia, but follow-up CT scan in 3-4 weeks is recommended to ensure resolution or stability.  Assessment:  62 y.o. male presenting with lung cancer, diabetes, PE previously on xarelto  presenting with left sided weakness, confusion. He states that at 1000 he looked at his watch and started feeling confused. His family noticed that his speech was slurred and he was weak on the left. Upon further conversation with his sister she said that she noticed him confused with slurred speech, leaning to the right in his chair, and drooling. They both received their shingles vaccines yesterday. She put a portable pulse ox on him that read 74% so she called EMS. They put him on a non-re breather and noted his glucose was 336. In route to [facility] he became less confused, but did endorse mid sternal chest pain. On exam NIH is 4 for dysarthria, left sensory deficit, and bilateral lower extremity weakness.   Impression: Hypoxic/hypercarbic respiratory failure with encephalopathy Pneumonia   Recommendations: - CXR - Corrected metabolic derangements  - Treatment of pneumonia deferred to ED Team   Patient seen and examined by NP/APP with MD. MD to update note as needed.   Elmer Picker, DNP, FNP-BC [facility] Pager: [phone]  I have seen the patient and reviewed the above note.  On my exam, he endorses symmetric sensation, but does have drift of bilateral lower extremities which is symmetric.  His symptoms all occurred in the setting of severe hypoxemia, which have markedly improved with oxygen supplementation.  At this point, I have low suspicion for cerebrovascular etiology and I would favor treating his underlying hypoxemia.  Neurology will continue to be available on an as-needed basis.  Ritta Slot, MD [facility] [phone]  If 7pm- 7am, please page neurology on call as listed in AMION.

## 2022-07-28 NOTE — Assessment & Plan Note (Signed)
Acute hypoxemic respiratory failure. Severe sepsis present on admission, end organ damage encephalopathy.   Plan to continue antibiotic therapy with IV ceftriaxone and oral azithromycin Add bronchodilator therapy. Incentive spirometer and flutter valve.  Out of bed to chair tid with meals. Continue with supplemental 02 per [place] to keep 02 saturation 92% or greater.  Hold on systemic steroids for now.

## 2022-07-28 NOTE — Assessment & Plan Note (Signed)
Continue blood pressure control with carvedilol and spironolactone.  Hold on diuretic therapy for now.

## 2022-07-28 NOTE — Assessment & Plan Note (Signed)
Follow up as outpatient.  

## 2022-07-28 NOTE — Assessment & Plan Note (Signed)
Continue insulin sliding scale for glucose cover and monitoring Continue with statin therapy.  

## 2022-07-28 NOTE — Assessment & Plan Note (Signed)
Resume home regimen including olanzapine, pregabalin, bupropion, fluoxetine.   Chronic pain syndrome continue morphine Neuro checks per unit protocol.

## 2022-07-28 NOTE — Code Documentation (Signed)
Jeremy Logan is a 62 yr old male arriving to [facility] on 07/28/2022 with a PMH of lung cancer, renal cell cancer, DM, COPD, bipolar disorder, DVT and PE. He is on no anticoagulant. Pt is from home where he was last known well today at 1000, and now is complaining of being "dazed and confused", dysarthric, and weak on the left. He was hypoxic when EMS arrived, and they placed him on a NRB. He developed chest pain en route.He also states he got his shingles vaccine yesterday.     Pt met at bridge by stroke team. Labs, CBG obtained. Airway cleared by EDP. Pt to CT scan with team. NIHSS 4. Pt with dysarthria, bilat leg drift, and decreased sensation on the right. The following imaging was obtained: CT, CTA. Per Dr. Amada Jupiter, CT neg for hemorrhage, and CTA neg for LVO.     Pt returned to ED room 3. Pt ineligible for thrombolytic due to OOW. Pt not candidate for thrombectomy as LVO negative. Code stroke cancelled by Dr. Amada Jupiter at 15:40. Further management per EDP.

## 2022-07-28 NOTE — ED Triage Notes (Signed)
Pt BIB EMS for left sided weakness, slurred speech, and confusion. Per EMS, LSN was 10am. Pt developed midsternal CP enroute. Pt received 324mg  of aspirin and 300cc of NS.

## 2022-07-28 NOTE — ED Provider Notes (Signed)
[place] [facility]PARTMENT AT [facility] Provider Note   CSN: [id number] Arrival date & time: 07/28/22  1508     History {Add pertinent medical, surgical, social history, OB history to HPI:1} No chief complaint on file.   Jeremy Putnam Donne Meckel. is a 62 y.o. male.  Patient presenting as a code stroke activation.  He has a history of lung cancer renal cell cancer DVT PE COPD diabetes.  Per EMS he became confused and had some difficulty with speech at 10 AM today.  On their arrival he had sats in the high 80s and was placed on oxygen.  Possibly had some left-sided weakness.  During transport patient also endorsed some chest pain.  He was evaluated at the bridge and taken immediately to CT for further evaluation.  Not currently on anticoagulation  The history is provided by the patient and the EMS personnel.  Cerebrovascular Accident This is a new problem. The current episode started 3 to 5 hours ago. The problem occurs constantly. The problem has not changed since onset.Associated symptoms include chest pain. Pertinent negatives include no abdominal pain, no headaches and no shortness of breath. Nothing aggravates the symptoms. Nothing relieves the symptoms. He has tried nothing for the symptoms. The treatment provided no relief.       Home Medications Prior to Admission medications   Medication Sig Start Date End Date Taking? Authorizing Provider  aspirin EC 81 MG tablet Take 81 mg by mouth daily.    [provider]  buPROPion (WELLBUTRIN XL) 150 MG 24 hr tablet Take 150 mg by mouth every morning. 08/25/21   [provider]  carvedilol (COREG) 3.125 MG tablet Take 3.125 mg by mouth 2 (two) times daily with a meal.  04/05/15   [provider]  Dulaglutide 4.5 MG/0.5ML SOPN Inject into the skin. 08/04/21   [provider]  ezetimibe (ZETIA) 10 MG tablet TAKE 1 TABLET BY MOUTH DAILY FOR CHOLESTEROL 01/05/20   [provider]  FARXIGA 10  MG TABS tablet Take by mouth. 02/10/22   [provider]  FLUoxetine (PROZAC) 20 MG capsule Take 40 mg by mouth 2 (two) times daily. 11/18/18   [provider]  furosemide (LASIX) 20 MG tablet Take 20 mg by mouth.    [provider]  metFORMIN (GLUCOPHAGE) 1000 MG tablet Take by mouth. 02/10/22   [provider]  morphine (MSIR) 15 MG tablet  01/08/22   [provider]  OLANZapine (ZYPREXA) 10 MG tablet Take 10 mg by mouth at bedtime. 05/30/20   [provider]  pregabalin (LYRICA) 150 MG capsule Take 150 mg by mouth every 12 (twelve) hours. 05/17/22   [provider]  rosuvastatin (CRESTOR) 40 MG tablet Take by mouth. 08/07/21   [provider]  spironolactone (ALDACTONE) 25 MG tablet Take 25 mg by mouth daily.    [provider]      Allergies    Atorvastatin    Review of Systems   Review of Systems  Constitutional:  Negative for fever.  Eyes:  Negative for visual disturbance.  Respiratory:  Negative for shortness of breath.   Cardiovascular:  Positive for chest pain.  Gastrointestinal:  Negative for abdominal pain.  Neurological:  Positive for speech difficulty. Negative for headaches.    Physical Exam Updated Vital Signs BP 129/75   Pulse 96   Resp (!) 23   Wt 120.2 kg   SpO2 94%   BMI 40.29 kg/m  Physical Exam  Vitals and nursing note reviewed.  Constitutional:      General: He is not in acute distress.    Appearance: Normal appearance. He is well-developed. He is obese.  HENT:     Head: Normocephalic and atraumatic.  Eyes:     Conjunctiva/sclera: Conjunctivae normal.  Cardiovascular:     Rate and Rhythm: Normal rate and regular rhythm.     Heart sounds: No murmur heard. Pulmonary:     Effort: Pulmonary effort is normal. No respiratory distress.     Breath sounds: Normal breath sounds.  Abdominal:     Palpations: Abdomen is soft.     Tenderness: There is no abdominal tenderness. There is  no guarding or rebound.  Musculoskeletal:        General: No swelling.     Cervical back: Neck supple.  Skin:    General: Skin is warm and dry.     Capillary Refill: Capillary refill takes less than 2 seconds.  Neurological:     Mental Status: He is alert.     ED Results / Procedures / Treatments   Labs (all labs ordered are listed, but only abnormal results are displayed) Labs Reviewed  PROTIME-INR - Abnormal; Notable for the following components:      Result Value   Prothrombin Time 15.7 (*)    All other components within normal limits  CBC - Abnormal; Notable for the following components:   WBC 16.5 (*)    All other components within normal limits  DIFFERENTIAL - Abnormal; Notable for the following components:   Neutro Abs 13.6 (*)    Monocytes Absolute 1.3 (*)    All other components within normal limits  COMPREHENSIVE METABOLIC PANEL - Abnormal; Notable for the following components:   Glucose, Bld 161 (*)    Calcium 8.7 (*)    Albumin 3.4 (*)    All other components within normal limits  I-STAT CHEM 8, ED - Abnormal; Notable for the following components:   Creatinine, Ser 1.30 (*)    Glucose, Bld 157 (*)    Calcium, Ion 1.09 (*)    All other components within normal limits  CBG MONITORING, ED - Abnormal; Notable for the following components:   Glucose-Capillary 149 (*)    All other components within normal limits  I-STAT VENOUS BLOOD GAS, ED - Abnormal; Notable for the following components:   pO2, Ven 49 (*)    Calcium, Ion 1.08 (*)    All other components within normal limits  CULTURE, BLOOD (ROUTINE X 2)  CULTURE, BLOOD (ROUTINE X 2)  APTT  ETHANOL  LACTIC ACID, PLASMA  LACTIC ACID, PLASMA  TROPONIN I (HIGH SENSITIVITY)    EKG EKG Interpretation  Date/Time:  Tuesday Jul 28 2022 15:34:52 EDT Ventricular Rate:  96 PR Interval:  195 QRS Duration: 107 QT Interval:  408 QTC Calculation: 516 R Axis:   59 Text Interpretation: Sinus rhythm Borderline low  voltage, extremity leads Prolonged QT interval increased rate and QT prolongation for prior 8/10 Confirmed by Meridee Score [phone]) on 07/28/2022 3:37:46 PM  Radiology CT ANGIO CHEST AORTA W/CM & OR WO/CM  Result Date: 07/28/2022 CLINICAL DATA:  Chest pain. EXAM: CT ANGIOGRAPHY CHEST WITH CONTRAST TECHNIQUE: Multidetector CT imaging of the chest was performed using the standard protocol during bolus administration of intravenous contrast. Multiplanar CT image reconstructions and MIPs were obtained to evaluate the vascular anatomy. RADIATION DOSE REDUCTION: This exam was performed according to the departmental dose-optimization program which includes automated exposure control, adjustment  of the mA and/or kV according to patient size and/or use of iterative reconstruction technique. CONTRAST:  OMNIPAQUE IOHEXOL 350 MG/ML SOLN COMPARISON:  June 02, 2022. FINDINGS: Cardiovascular: Preferential opacification of the thoracic aorta. No evidence of thoracic aortic aneurysm or dissection. Normal heart size. No pericardial effusion. Mediastinum/Nodes: Esophagus is unremarkable. Thyroid gland is unremarkable. 12 mm right paratracheal lymph node is noted which is enlarged compared to prior exam. Subcarinal adenopathy is noted with minor axis of 15 mm which is significantly enlarged compared to prior exam. Right hilar adenopathy measuring 14 mm is also noted. Lungs/Pleura: No pneumothorax or pleural effusion is noted. Patchy nodular opacities are seen in both lungs, most prominently in the right lower lobe most likely due to pneumonia. Similar opacities are also seen in the left lower lobe and right upper lobe. Stable scarring or fibrosis is noted in the right upper lobe. Upper Abdomen: No acute abnormality. Musculoskeletal: Stable sclerotic density is seen involving T1 and T2 as well as posterior portion of right second rib most likely due to previous radiation treatment. No acute fracture is noted. Review of the  MIP images confirms the above findings. IMPRESSION: No evidence of thoracic aortic dissection or aneurysm. However, there is interval element of large opacity seen involving right lower lobe most consistent with pneumonia. Multiple smaller patchy airspace opacities are noted in the left lower lobe and right upper lobe most likely inflammatory in etiology, suggesting multifocal pneumonia. Enlarged right paratracheal, subcarinal and right hilar lymph nodes are noted which most likely are inflammatory in etiology given the presence of pneumonia, but follow-up CT scan in 3-4 weeks is recommended to ensure resolution or stability. Electronically Signed   By: Lupita Raider M.D.   On: 07/28/2022 15:51   CT HEAD CODE STROKE WO CONTRAST  Result Date: 07/28/2022 CLINICAL DATA:  Code stroke.  Neuro deficit, stroke suspected. EXAM: CT ANGIOGRAPHY HEAD AND NECK TECHNIQUE: Multidetector CT imaging of the head and neck was performed using the standard protocol during bolus administration of intravenous contrast. Multiplanar CT image reconstructions and MIPs were obtained to evaluate the vascular anatomy. Carotid stenosis measurements (when applicable) are obtained utilizing NASCET criteria, using the distal internal carotid diameter as the denominator. RADIATION DOSE REDUCTION: This exam was performed according to the departmental dose-optimization program which includes automated exposure control, adjustment of the mA and/or kV according to patient size and/or use of iterative reconstruction technique. CONTRAST:  OMNIPAQUE IOHEXOL 350 MG/ML SOLN COMPARISON:  CT head 10/21/2015 FINDINGS: CT HEAD FINDINGS Brain: There is no acute intracranial hemorrhage, extra-axial fluid collection, or acute infarct. Parenchymal volume is normal. The ventricles are normal in size. Gray-white differentiation is preserved. The pituitary and suprasellar region are normal. There is no mass lesion. There is no mass effect or midline shift.  Vascular: No hyperdense vessel or unexpected calcification. Skull: Normal. Negative for fracture or focal lesion. Sinuses/Orbits: The paranasal sinuses are clear. The globes and orbits are unremarkable. Other: None. ASPECTS [facility] Stroke Program Early CT Score) - Ganglionic level infarction (caudate, lentiform nuclei, internal capsule, insula, M1-M3 cortex): 7 - Supraganglionic infarction (M4-M6 cortex): 3 Total score (0-10 with 10 being normal): 10 CTA NECK FINDINGS Aortic arch: The imaged aortic arch is normal. The origins of the major branch vessels are patent. The subclavian arteries are patent to the level imaged. Right carotid system: The right common, internal, and external carotid arteries are patent, with mild plaque of the bifurcation but no hemodynamically significant stenosis or occlusion there  is no evidence of dissection or aneurysm. Left carotid system: The left common, internal, and external carotid arteries are patent, with mild plaque of the bifurcation but no hemodynamically significant stenosis or occlusion. There is no evidence of dissection or aneurysm. Vertebral arteries: The vertebral arteries are patent, without hemodynamically significant stenosis or occlusion. There is no evidence of dissection or aneurysm. Skeleton: There is no acute osseous abnormality or suspicious osseous lesion. There is no visible canal hematoma. Other neck: The soft tissues of the neck are unremarkable. Upper chest: Assessed on the separately dictated CT chest. Review of the MIP images confirms the above findings CTA HEAD FINDINGS Anterior circulation: There is mild calcified plaque in the intracranial ICAs without significant stenosis or occlusion. The bilateral MCAs are patent, without proximal stenosis or occlusion. The bilateral ACAs are patent, without proximal stenosis or occlusion. The anterior communicating artery is normal. There is no aneurysm or AVM. Posterior circulation: The bilateral V4 segments are  patent. The basilar artery is patent. The major cerebellar arteries appear patent. The bilateral PCAs are patent, without proximal stenosis or occlusion. Posterior communicating arteries are not identified. There is no aneurysm or AVM. Venous sinuses: Patent. Anatomic variants: None. Review of the MIP images confirms the above findings IMPRESSION: 1. No acute intracranial pathology. 2. Patent vasculature of the head and neck with no hemodynamically significant stenosis, occlusion, or dissection. Findings communicated to Dr. Amada Jupiter via Loretha Stapler at 3:25 pm. Electronically Signed   By: Lesia Hausen M.D.   On: 07/28/2022 15:39   CT ANGIO HEAD NECK W WO CM (CODE STROKE)  Result Date: 07/28/2022 CLINICAL DATA:  Code stroke.  Neuro deficit, stroke suspected. EXAM: CT ANGIOGRAPHY HEAD AND NECK TECHNIQUE: Multidetector CT imaging of the head and neck was performed using the standard protocol during bolus administration of intravenous contrast. Multiplanar CT image reconstructions and MIPs were obtained to evaluate the vascular anatomy. Carotid stenosis measurements (when applicable) are obtained utilizing NASCET criteria, using the distal internal carotid diameter as the denominator. RADIATION DOSE REDUCTION: This exam was performed according to the departmental dose-optimization program which includes automated exposure control, adjustment of the mA and/or kV according to patient size and/or use of iterative reconstruction technique. CONTRAST:  OMNIPAQUE IOHEXOL 350 MG/ML SOLN COMPARISON:  CT head 10/21/2015 FINDINGS: CT HEAD FINDINGS Brain: There is no acute intracranial hemorrhage, extra-axial fluid collection, or acute infarct. Parenchymal volume is normal. The ventricles are normal in size. Gray-white differentiation is preserved. The pituitary and suprasellar region are normal. There is no mass lesion. There is no mass effect or midline shift. Vascular: No hyperdense vessel or unexpected calcification.  Skull: Normal. Negative for fracture or focal lesion. Sinuses/Orbits: The paranasal sinuses are clear. The globes and orbits are unremarkable. Other: None. ASPECTS [facility] Stroke Program Early CT Score) - Ganglionic level infarction (caudate, lentiform nuclei, internal capsule, insula, M1-M3 cortex): 7 - Supraganglionic infarction (M4-M6 cortex): 3 Total score (0-10 with 10 being normal): 10 CTA NECK FINDINGS Aortic arch: The imaged aortic arch is normal. The origins of the major branch vessels are patent. The subclavian arteries are patent to the level imaged. Right carotid system: The right common, internal, and external carotid arteries are patent, with mild plaque of the bifurcation but no hemodynamically significant stenosis or occlusion there is no evidence of dissection or aneurysm. Left carotid system: The left common, internal, and external carotid arteries are patent, with mild plaque of the bifurcation but no hemodynamically significant stenosis or occlusion. There is no evidence  of dissection or aneurysm. Vertebral arteries: The vertebral arteries are patent, without hemodynamically significant stenosis or occlusion. There is no evidence of dissection or aneurysm. Skeleton: There is no acute osseous abnormality or suspicious osseous lesion. There is no visible canal hematoma. Other neck: The soft tissues of the neck are unremarkable. Upper chest: Assessed on the separately dictated CT chest. Review of the MIP images confirms the above findings CTA HEAD FINDINGS Anterior circulation: There is mild calcified plaque in the intracranial ICAs without significant stenosis or occlusion. The bilateral MCAs are patent, without proximal stenosis or occlusion. The bilateral ACAs are patent, without proximal stenosis or occlusion. The anterior communicating artery is normal. There is no aneurysm or AVM. Posterior circulation: The bilateral V4 segments are patent. The basilar artery is patent. The major cerebellar  arteries appear patent. The bilateral PCAs are patent, without proximal stenosis or occlusion. Posterior communicating arteries are not identified. There is no aneurysm or AVM. Venous sinuses: Patent. Anatomic variants: None. Review of the MIP images confirms the above findings IMPRESSION: 1. No acute intracranial pathology. 2. Patent vasculature of the head and neck with no hemodynamically significant stenosis, occlusion, or dissection. Findings communicated to Dr. Amada Jupiter via Loretha Stapler at 3:25 pm. Electronically Signed   By: Lesia Hausen M.D.   On: 07/28/2022 15:39    Procedures .Critical Care  Performed by: Terrilee Files, MD Authorized by: Terrilee Files, MD   Critical care provider statement:    Critical care time (minutes):  45   Critical care time was exclusive of:  Separately billable procedures and treating other patients   Critical care was necessary to treat or prevent imminent or life-threatening deterioration of the following conditions:  Respiratory failure and CNS failure or compromise   Critical care was time spent personally by me on the following activities:  Development of treatment plan with patient or surrogate, discussions with consultants, evaluation of patient's response to treatment, examination of patient, obtaining history from patient or surrogate, ordering and performing treatments and interventions, ordering and review of laboratory studies, ordering and review of radiographic studies, pulse oximetry, re-evaluation of patient's condition and review of old charts   I assumed direction of critical care for this patient from another provider in my specialty: no     {Document cardiac monitor, telemetry assessment procedure when appropriate:1}  Medications Ordered in ED Medications  sodium chloride flush (NS) 0.9 % injection 3 mL (has no administration in time range)  cefTRIAXone (ROCEPHIN) 1 g in sodium chloride 0.9 % 100 mL IVPB (has no administration in time  range)  azithromycin (ZITHROMAX) 500 mg in sodium chloride 0.9 % 250 mL IVPB (has no administration in time range)  iohexol (OMNIPAQUE) 350 MG/ML injection 100 mL (100 mLs Intravenous Contrast Given 07/28/22 1532)    ED Course/ Medical Decision Making/ A&P Clinical Course as of 07/28/22 1621  Tue Jul 28, 2022  1538 Further information from patient's sister.  She found him somnolent and his pulse ox was 74%.  Patient's symptoms seem to be improving with administration of oxygen.  Head CT does not show any acute findings.  Neurology feels this may be related to some hypercapnia.   They do not feel he needs further imaging at this time. [MB]  1619 Reviewed results of workup with patient and he is comfortable with plan for medical admission for further evaluation as hypoxia/pneumonia. [MB]    Clinical Course User Index [MB] Terrilee Files, MD   {   Click here  for ABCD2, HEART and other calculatorsREFRESH Note before signing :1}                          Medical Decision Making Amount and/or Complexity of Data Reviewed Labs: ordered. Radiology: ordered.   This patient complains of ***; this involves an extensive number of treatment Options and is a complaint that carries with it a high risk of complications and morbidity. The differential includes ***  I ordered, reviewed and interpreted labs, which included *** I ordered medication *** and reviewed PMP when indicated. I ordered imaging studies which included *** and I independently    visualized and interpreted imaging which showed *** Additional history obtained from *** Previous records obtained and reviewed *** I consulted *** and discussed lab and imaging findings and discussed disposition.  Cardiac monitoring reviewed, *** Social determinants considered, *** Critical Interventions: ***  After the interventions stated above, I reevaluated the patient and found *** Admission and further testing considered, ***   {Document  critical care time when appropriate:1} {Document review of labs and clinical decision tools ie heart score, Chads2Vasc2 etc:1}  {Document your independent review of radiology images, and any outside records:1} {Document your discussion with family members, caretakers, and with consultants:1} {Document social determinants of health affecting pt's care:1} {Document your decision making why or why not admission, treatments were needed:1} Final Clinical Impression(s) / ED Diagnoses Final diagnoses:  None    Rx / DC Orders ED Discharge Orders     None

## 2022-07-29 DIAGNOSIS — I10 Essential (primary) hypertension: Secondary | ICD-10-CM | POA: Diagnosis not present

## 2022-07-29 DIAGNOSIS — J189 Pneumonia, unspecified organism: Secondary | ICD-10-CM | POA: Diagnosis not present

## 2022-07-29 DIAGNOSIS — E1169 Type 2 diabetes mellitus with other specified complication: Secondary | ICD-10-CM | POA: Diagnosis not present

## 2022-07-29 DIAGNOSIS — J9601 Acute respiratory failure with hypoxia: Secondary | ICD-10-CM | POA: Diagnosis not present

## 2022-07-29 LAB — GLUCOSE, CAPILLARY
Glucose-Capillary: 96 mg/dL (ref 70–99)
Glucose-Capillary: 203 mg/dL — ABNORMAL HIGH (ref 70–99)
Glucose-Capillary: 103 mg/dL — ABNORMAL HIGH (ref 70–99)
Glucose-Capillary: 117 mg/dL — ABNORMAL HIGH (ref 70–99)

## 2022-07-29 LAB — BASIC METABOLIC PANEL
Anion gap: 8 (ref 5–15)
BUN: 13 mg/dL (ref 8–23)
CO2: 24 mmol/L (ref 22–32)
Calcium: 8.9 mg/dL (ref 8.9–10.3)
Chloride: 104 mmol/L (ref 98–111)
Creatinine, Ser: 1.04 mg/dL (ref 0.61–1.24)
GFR, Estimated: >60 mL/min (ref >60)
Glucose, Bld: 122 mg/dL — ABNORMAL HIGH (ref 70–99)
Potassium: 4 mmol/L (ref 3.5–5.1)
Sodium: 136 mmol/L (ref 135–145)

## 2022-07-29 LAB — CBC
HCT: 40.5 % (ref 39.0–52.0)
Hemoglobin: 12.9 g/dL — ABNORMAL LOW (ref 13.0–17.0)
MCH: 28.8 pg (ref 26.0–34.0)
MCHC: 31.9 g/dL (ref 30.0–36.0)
MCV: 90.4 fL (ref 80.0–100.0)
Platelets: 270 10*3/uL (ref 150–400)
RBC: 4.48 MIL/uL (ref 4.22–5.81)
RDW: 14.7 % (ref 11.5–15.5)
WBC: 19.1 10*3/uL — ABNORMAL HIGH (ref 4.0–10.5)
nRBC: 0 % (ref 0.0–0.2)

## 2022-07-29 MED ORDER — HYDROMORPHONE HCL 2 MG PO TABS
4.0000 mg | ORAL_TABLET | Freq: Four times a day (QID) | ORAL | Status: DC | PRN
  Administered 2022-07-29 – 2022-07-31 (×8): 4 mg via ORAL
  Filled 2022-07-29 (×8): qty 2

## 2022-07-29 MED ORDER — SENNA 8.6 MG PO TABS: 1.0000 | ORAL_TABLET | Freq: Every day | ORAL | Status: DC | PRN

## 2022-07-29 MED ORDER — HYDROMORPHONE HCL 2 MG PO TABS: 4.0000 mg | ORAL_TABLET | Freq: Four times a day (QID) | ORAL | Status: DC | PRN

## 2022-07-29 NOTE — Hospital Course (Signed)
Jeremy Logan. is a 62 y.o. male with history of diabetes mellitus type 2, lung and renal cell cancer, COPD, DVT/PE, obesity, bipolar disorder.  Patient presented secondary to worsening dyspnea and cough and was found to have evidence of right lower lobe/multifocal pneumonia with associated sepsis.  Patient started on empiric ceftriaxone/azithromycin and blood cultures obtained.  Hospitalization complicated by acute respiratory failure secondary to pneumonia requiring supplemental oxygen.

## 2022-07-29 NOTE — Progress Notes (Addendum)
PROGRESS NOTE    Jeremy Logan.  HYQ:[id number] DOB: Sep 07, 1960 DOA: 07/28/2022 PCP: Olive Bass, MD   Brief Narrative: Jeremy Putnam Argenis Steeb. is a 62 y.o. male with history of diabetes mellitus type 2, lung and renal cell cancer, COPD, DVT/PE, obesity, bipolar disorder.  Patient presented secondary to worsening dyspnea and cough and was found to have evidence of right lower lobe/multifocal pneumonia with associated sepsis.  Patient started on empiric ceftriaxone/azithromycin and blood cultures obtained.  Hospitalization complicated by acute respiratory failure secondary to pneumonia requiring supplemental oxygen.   Assessment and Plan:  Severe sepsis Present on admission with associated acute metabolic encephalopathy.  Blood cultures obtained on admission and are pending.  Patient started empiric ceftriaxone azithromycin for treatment of pneumonia as source.  Right lower lobe pneumonia Multifocal pneumonia Noted on CT imaging. Patient started on azithromycin.  Complicated by new oxygen requirement this admission. -Continue ceftriaxone azithromycin  Acute respiratory failure with hypoxia Secondary to pneumonia. Per EMS report, patient was hypoxic on their arrival requiring placement of oxygen. Patient requiring up to 5 L/min of supplemental oxygen.  Paratracheal, subcarinal and hilar lymph nodes Noted on chest CT. Presumed inflammatory secondary to acute infection. Complicated by patient's history of lung cancer. Radiology recommendation for follow-up CT scan in 3-4 weeks to ensure resolution/stability.  Chronic diastolic heart failure Stable. No evidence of exacerbation.  Primary hypertension -Continue Coreg  Diabetes mellitus type 2 Well controlled. Patient is managed on metformin and dulaglutide. Hemoglobin A1C of 6.1%. -Continue SSI  Hyperlipidemia -Continue Crestor  COPD Stable. Likely contributing to respiratory failure -Continue Duoneb  Stage IIIa non-small  cell lung cancer Stage I renal cell carcinoma Noted.  Patient follows with Dr. Gilman Buttner, oncology as an outpatient.  Bipolar disorder -Continue Zyprexa, Wellbutrin, Prozac  Chronic pain -Continue Lyrica and resume home hydromorphone  Obesity Estimated body mass index is 36.96 kg/m as calculated from the following:   Height as of this encounter: 5\' 11"  (1.803 m).   Weight as of this encounter: 120.2 kg.   DVT prophylaxis: Lovenox Code Status:   Code Status: Full Code Family Communication: Sister at bedside Disposition Plan: Discharge pending improvement of oxygen requirement and transition to outpatient antibiotic regimen   Consultants:  None  Procedures:  None  Antimicrobials: Ceftriaxone IV Azithromycin   Subjective: Patient reports dyspnea.  Patient with significant dyspnea on exertion.  No chest pain.  Objective: BP 120/79 (BP Location: Right Arm)   Pulse (!) 115   Temp 98.3 F (36.8 C)   Resp (!) 22   Ht 5\' 11"  (1.803 m)   Wt 120.2 kg   SpO2 100%   BMI 36.96 kg/m   Examination:  General exam: Appears calm and comfortable Respiratory system: Coarse breath sounds. Respiratory effort normal. Cardiovascular system: S1 & S2 heard, RRR. No murmurs, rubs, gallops or clicks. Gastrointestinal system: Abdomen is nondistended, soft and nontender. No organomegaly or masses felt. Normal bowel sounds heard. Central nervous system: Alert and oriented. No focal neurological deficits. Musculoskeletal: No calf tenderness Skin: No cyanosis. No rashes Psychiatry: Judgement and insight appear normal. Mood & affect appropriate.    Data Reviewed: I have personally reviewed following labs and imaging studies  CBC Lab Results  Component Value Date   WBC 19.1 (H) 07/29/2022   RBC 4.48 07/29/2022   HGB 12.9 (L) 07/29/2022   HCT 40.5 07/29/2022   MCV 90.4 07/29/2022   MCH 28.8 07/29/2022   PLT 270 07/29/2022   MCHC 31.9 07/29/2022  RDW 14.7 07/29/2022   LYMPHSABS  1.4 07/28/2022   MONOABS 1.3 (H) 07/28/2022   EOSABS 0.1 07/28/2022   BASOSABS 0.0 07/28/2022     Last metabolic panel Lab Results  Component Value Date   NA 136 07/29/2022   K 4.0 07/29/2022   CL 104 07/29/2022   CO2 24 07/29/2022   BUN 13 07/29/2022   CREATININE 1.04 07/29/2022   GLUCOSE 122 (H) 07/29/2022   GFRNONAA >60 07/29/2022   GFRAA  11/02/2008    >60        The eGFR has been calculated using the MDRD equation. This calculation has not been validated in all clinical situations. eGFR's persistently <60 mL/min signify possible Chronic Kidney Disease.   CALCIUM 8.9 07/29/2022   PROT 6.7 07/28/2022   ALBUMIN 3.4 (L) 07/28/2022   BILITOT 0.5 07/28/2022   ALKPHOS 52 07/28/2022   AST 21 07/28/2022   ALT 19 07/28/2022   ANIONGAP 8 07/29/2022    GFR: Estimated Creatinine Clearance: 97.2 mL/min (by C-G formula based on SCr of 1.04 mg/dL).  Recent Results (from the past 240 hour(s))  Culture, blood (routine x 2)     Status: None (Preliminary result)   Collection Time: 07/28/22  4:15 PM   Specimen: BLOOD LEFT ARM  Result Value Ref Range Status   Specimen Description BLOOD LEFT ARM  Final   Special Requests   Final    BOTTLES DRAWN AEROBIC AND ANAEROBIC Blood Culture adequate volume   Culture   Final    NO GROWTH < 24 HOURS Performed at [facility], [address] [place], [place] [address]    Report Status PENDING  Incomplete  Culture, blood (routine x 2)     Status: None (Preliminary result)   Collection Time: 07/28/22  4:17 PM   Specimen: BLOOD RIGHT ARM  Result Value Ref Range Status   Specimen Description BLOOD RIGHT ARM  Final   Special Requests   Final    BOTTLES DRAWN AEROBIC AND ANAEROBIC Blood Culture adequate volume   Culture   Final    NO GROWTH < 24 HOURS Performed at [facility], [address] [place], [place] [address]    Report Status PENDING  Incomplete      Radiology Studies: CT ANGIO CHEST AORTA W/CM & OR  WO/CM  Result Date: 07/28/2022 CLINICAL DATA:  Chest pain. EXAM: CT ANGIOGRAPHY CHEST WITH CONTRAST TECHNIQUE: Multidetector CT imaging of the chest was performed using the standard protocol during bolus administration of intravenous contrast. Multiplanar CT image reconstructions and MIPs were obtained to evaluate the vascular anatomy. RADIATION DOSE REDUCTION: This exam was performed according to the departmental dose-optimization program which includes automated exposure control, adjustment of the mA and/or kV according to patient size and/or use of iterative reconstruction technique. CONTRAST:  OMNIPAQUE IOHEXOL 350 MG/ML SOLN COMPARISON:  June 02, 2022. FINDINGS: Cardiovascular: Preferential opacification of the thoracic aorta. No evidence of thoracic aortic aneurysm or dissection. Normal heart size. No pericardial effusion. Mediastinum/Nodes: Esophagus is unremarkable. Thyroid gland is unremarkable. 12 mm right paratracheal lymph node is noted which is enlarged compared to prior exam. Subcarinal adenopathy is noted with minor axis of 15 mm which is significantly enlarged compared to prior exam. Right hilar adenopathy measuring 14 mm is also noted. Lungs/Pleura: No pneumothorax or pleural effusion is noted. Patchy nodular opacities are seen in both lungs, most prominently in the right lower lobe most likely due to pneumonia. Similar opacities are also seen in  the left lower lobe and right upper lobe. Stable scarring or fibrosis is noted in the right upper lobe. Upper Abdomen: No acute abnormality. Musculoskeletal: Stable sclerotic density is seen involving T1 and T2 as well as posterior portion of right second rib most likely due to previous radiation treatment. No acute fracture is noted. Review of the MIP images confirms the above findings. IMPRESSION: No evidence of thoracic aortic dissection or aneurysm. However, there is interval element of large opacity seen involving right lower lobe most  consistent with pneumonia. Multiple smaller patchy airspace opacities are noted in the left lower lobe and right upper lobe most likely inflammatory in etiology, suggesting multifocal pneumonia. Enlarged right paratracheal, subcarinal and right hilar lymph nodes are noted which most likely are inflammatory in etiology given the presence of pneumonia, but follow-up CT scan in 3-4 weeks is recommended to ensure resolution or stability. Electronically Signed   By: Lupita Raider M.D.   On: 07/28/2022 15:51   CT HEAD CODE STROKE WO CONTRAST  Result Date: 07/28/2022 CLINICAL DATA:  Code stroke.  Neuro deficit, stroke suspected. EXAM: CT ANGIOGRAPHY HEAD AND NECK TECHNIQUE: Multidetector CT imaging of the head and neck was performed using the standard protocol during bolus administration of intravenous contrast. Multiplanar CT image reconstructions and MIPs were obtained to evaluate the vascular anatomy. Carotid stenosis measurements (when applicable) are obtained utilizing NASCET criteria, using the distal internal carotid diameter as the denominator. RADIATION DOSE REDUCTION: This exam was performed according to the departmental dose-optimization program which includes automated exposure control, adjustment of the mA and/or kV according to patient size and/or use of iterative reconstruction technique. CONTRAST:  OMNIPAQUE IOHEXOL 350 MG/ML SOLN COMPARISON:  CT head 10/21/2015 FINDINGS: CT HEAD FINDINGS Brain: There is no acute intracranial hemorrhage, extra-axial fluid collection, or acute infarct. Parenchymal volume is normal. The ventricles are normal in size. Gray-white differentiation is preserved. The pituitary and suprasellar region are normal. There is no mass lesion. There is no mass effect or midline shift. Vascular: No hyperdense vessel or unexpected calcification. Skull: Normal. Negative for fracture or focal lesion. Sinuses/Orbits: The paranasal sinuses are clear. The globes and orbits are  unremarkable. Other: None. ASPECTS [facility] Stroke Program Early CT Score) - Ganglionic level infarction (caudate, lentiform nuclei, internal capsule, insula, M1-M3 cortex): 7 - Supraganglionic infarction (M4-M6 cortex): 3 Total score (0-10 with 10 being normal): 10 CTA NECK FINDINGS Aortic arch: The imaged aortic arch is normal. The origins of the major branch vessels are patent. The subclavian arteries are patent to the level imaged. Right carotid system: The right common, internal, and external carotid arteries are patent, with mild plaque of the bifurcation but no hemodynamically significant stenosis or occlusion there is no evidence of dissection or aneurysm. Left carotid system: The left common, internal, and external carotid arteries are patent, with mild plaque of the bifurcation but no hemodynamically significant stenosis or occlusion. There is no evidence of dissection or aneurysm. Vertebral arteries: The vertebral arteries are patent, without hemodynamically significant stenosis or occlusion. There is no evidence of dissection or aneurysm. Skeleton: There is no acute osseous abnormality or suspicious osseous lesion. There is no visible canal hematoma. Other neck: The soft tissues of the neck are unremarkable. Upper chest: Assessed on the separately dictated CT chest. Review of the MIP images confirms the above findings CTA HEAD FINDINGS Anterior circulation: There is mild calcified plaque in the intracranial ICAs without significant stenosis or occlusion. The bilateral MCAs are patent, without proximal stenosis  or occlusion. The bilateral ACAs are patent, without proximal stenosis or occlusion. The anterior communicating artery is normal. There is no aneurysm or AVM. Posterior circulation: The bilateral V4 segments are patent. The basilar artery is patent. The major cerebellar arteries appear patent. The bilateral PCAs are patent, without proximal stenosis or occlusion. Posterior communicating arteries  are not identified. There is no aneurysm or AVM. Venous sinuses: Patent. Anatomic variants: None. Review of the MIP images confirms the above findings IMPRESSION: 1. No acute intracranial pathology. 2. Patent vasculature of the head and neck with no hemodynamically significant stenosis, occlusion, or dissection. Findings communicated to Dr. Amada Jupiter via Loretha Stapler at 3:25 pm. Electronically Signed   By: Lesia Hausen M.D.   On: 07/28/2022 15:39   CT ANGIO HEAD NECK W WO CM (CODE STROKE)  Result Date: 07/28/2022 CLINICAL DATA:  Code stroke.  Neuro deficit, stroke suspected. EXAM: CT ANGIOGRAPHY HEAD AND NECK TECHNIQUE: Multidetector CT imaging of the head and neck was performed using the standard protocol during bolus administration of intravenous contrast. Multiplanar CT image reconstructions and MIPs were obtained to evaluate the vascular anatomy. Carotid stenosis measurements (when applicable) are obtained utilizing NASCET criteria, using the distal internal carotid diameter as the denominator. RADIATION DOSE REDUCTION: This exam was performed according to the departmental dose-optimization program which includes automated exposure control, adjustment of the mA and/or kV according to patient size and/or use of iterative reconstruction technique. CONTRAST:  OMNIPAQUE IOHEXOL 350 MG/ML SOLN COMPARISON:  CT head 10/21/2015 FINDINGS: CT HEAD FINDINGS Brain: There is no acute intracranial hemorrhage, extra-axial fluid collection, or acute infarct. Parenchymal volume is normal. The ventricles are normal in size. Gray-white differentiation is preserved. The pituitary and suprasellar region are normal. There is no mass lesion. There is no mass effect or midline shift. Vascular: No hyperdense vessel or unexpected calcification. Skull: Normal. Negative for fracture or focal lesion. Sinuses/Orbits: The paranasal sinuses are clear. The globes and orbits are unremarkable. Other: None. ASPECTS [facility] Stroke Program  Early CT Score) - Ganglionic level infarction (caudate, lentiform nuclei, internal capsule, insula, M1-M3 cortex): 7 - Supraganglionic infarction (M4-M6 cortex): 3 Total score (0-10 with 10 being normal): 10 CTA NECK FINDINGS Aortic arch: The imaged aortic arch is normal. The origins of the major branch vessels are patent. The subclavian arteries are patent to the level imaged. Right carotid system: The right common, internal, and external carotid arteries are patent, with mild plaque of the bifurcation but no hemodynamically significant stenosis or occlusion there is no evidence of dissection or aneurysm. Left carotid system: The left common, internal, and external carotid arteries are patent, with mild plaque of the bifurcation but no hemodynamically significant stenosis or occlusion. There is no evidence of dissection or aneurysm. Vertebral arteries: The vertebral arteries are patent, without hemodynamically significant stenosis or occlusion. There is no evidence of dissection or aneurysm. Skeleton: There is no acute osseous abnormality or suspicious osseous lesion. There is no visible canal hematoma. Other neck: The soft tissues of the neck are unremarkable. Upper chest: Assessed on the separately dictated CT chest. Review of the MIP images confirms the above findings CTA HEAD FINDINGS Anterior circulation: There is mild calcified plaque in the intracranial ICAs without significant stenosis or occlusion. The bilateral MCAs are patent, without proximal stenosis or occlusion. The bilateral ACAs are patent, without proximal stenosis or occlusion. The anterior communicating artery is normal. There is no aneurysm or AVM. Posterior circulation: The bilateral V4 segments are patent. The basilar artery is patent.  The major cerebellar arteries appear patent. The bilateral PCAs are patent, without proximal stenosis or occlusion. Posterior communicating arteries are not identified. There is no aneurysm or AVM. Venous  sinuses: Patent. Anatomic variants: None. Review of the MIP images confirms the above findings IMPRESSION: 1. No acute intracranial pathology. 2. Patent vasculature of the head and neck with no hemodynamically significant stenosis, occlusion, or dissection. Findings communicated to Dr. Amada Jupiter via Loretha Stapler at 3:25 pm. Electronically Signed   By: Lesia Hausen M.D.   On: 07/28/2022 15:39      LOS: 1 day    Jacquelin Hawking, MD [facility] 07/29/2022, 10:48 AM   If 7PM-7AM, please contact night-coverage [email or web address]

## 2022-07-29 NOTE — Evaluation (Signed)
Occupational Therapy Evaluation Patient Details Name: Jeremy Logan. MRN: [id number] DOB: 15-Feb-1961 Today's Date: 07/29/2022   History of Present Illness Pt is a 62 y/o M admitted on 07/28/22 after presenting with AMS. Pt is being treated for PNA. PMH: lung & renal CA, DM2, COPD, DVT/PE, obesity, bipolar disorder   Clinical Impression   Jeremy Logan was evaluated s/p the above admission list. Pt reports independence with ADLs and mobility without use of AD at baseline. During session, pt required verbal cues for Jeremy Logan management and energy conservation during ADLs. Pt did not require physical assist for transfers, mobility or ADLs during session. Pt tolerated session well with VSS on 5L [place] throughout. Pt does not require further acute OT services. OT does not recommend follow up OT services at this time.       Recommendations for follow up therapy are one component of a multi-disciplinary discharge planning process, led by the attending physician.  Recommendations may be updated based on patient status, additional functional criteria and insurance authorization.   Assistance Recommended at Discharge PRN  Patient can return home with the following Assist for transportation    Functional Status Assessment  Patient has had a recent decline in their functional status and demonstrates the ability to make significant improvements in function in a reasonable and predictable amount of time.  Equipment Recommendations  Tub/shower seat       Precautions / Restrictions Precautions Precautions: None Restrictions Weight Bearing Restrictions: No      Mobility Bed Mobility Overal bed mobility: Independent                  Transfers Overall transfer level: Independent                        Balance Overall balance assessment: No apparent balance deficits (not formally assessed)           ADL either performed or assessed with clinical judgement   ADL Overall ADL's : Needs  assistance/impaired                 General ADL Comments: Cues needed for management of Jeremy Logan during ADLs - no physical assist needed. Reviewed energy conservation strategies for self care     Vision Baseline Vision/History: 0 No visual deficits Vision Assessment?: No apparent visual deficits     Perception Perception Perception Tested?: No   Praxis Praxis Praxis tested?: Within functional limits    Pertinent Vitals/Pain Pain Assessment Pain Assessment: 0-10 Pain Score: 8  Faces Pain Scale: Hurts a little bit Pain Location: L side of abdomen (notes this is chronic pain, pt has "bulge" 2/2 hx of renal CA) Pain Descriptors / Indicators: Discomfort Pain Intervention(s): Monitored during session, Limited activity within patient's tolerance     Hand Dominance Right   Extremity/Trunk Assessment Upper Extremity Assessment Upper Extremity Assessment: Overall WFL for tasks assessed   Lower Extremity Assessment Lower Extremity Assessment: Overall WFL for tasks assessed   Cervical / Trunk Assessment Cervical / Trunk Assessment: Normal   Communication Communication Communication: No difficulties   Cognition Arousal/Alertness: Awake/alert Behavior During Therapy: WFL for tasks assessed/performed Overall Cognitive Status: Impaired/Different from baseline Area of Impairment: Safety/judgement                         Safety/Judgement: Decreased awareness of deficits     General Comments: Overall WFL for basic tasks. Session focused on energy conservation education and management of  Jeremy Logan with ADLs. Pt noted to have reduced understanding of implication of supplamental O2     General Comments  5L upon arrival and throughout session. SpO2 >92%     Home Living Family/patient expects to be discharged to:: Private residence Living Arrangements: Non-relatives/Friends Available Help at Discharge: Friend(s);Available PRN/intermittently Type of Home: Mobile home Home  Access: Stairs to enter Entrance Stairs-Number of Steps: 3 Entrance Stairs-Rails: Left Home Layout: One level     Bathroom Shower/Tub: Chief Strategy Officer: Standard                Prior Functioning/Environment Prior Level of Function : Independent/Modified Independent;Driving             Mobility Comments: Independent without AD ADLs Comments: reported to PT he does drive, reported this session he does not        OT Problem List: Cardiopulmonary status limiting activity         OT Goals(Current goals can be found in the care plan section) Acute Rehab OT Goals Patient Stated Goal: home OT Goal Formulation: With patient Time For Goal Achievement: 07/29/22 Potential to Achieve Goals: Good   AM-PAC OT "6 Clicks" Daily Activity     Outcome Measure Help from another person eating meals?: None Help from another person taking care of personal grooming?: None Help from another person toileting, which includes using toliet, bedpan, or urinal?: None Help from another person bathing (including washing, rinsing, drying)?: None Help from another person to put on and taking off regular upper body clothing?: None Help from another person to put on and taking off regular lower body clothing?: None 6 Click Score: 24   End of Session Equipment Utilized During Treatment: Oxygen Nurse Communication: Mobility status  Activity Tolerance: Patient tolerated treatment well Patient left: in bed;with call bell/phone within reach  OT Visit Diagnosis: Pain                Time: 1478-2956 OT Time Calculation (min): 15 min Charges:  OT General Charges $OT Visit: 1 Visit OT Evaluation $OT Eval Low Complexity: 1 Low  Derenda Mis, OTR/L Acute Rehabilitation Services Office [phone] Secure Chat Communication Preferred   Donia Pounds 07/29/2022, 3:44 PM

## 2022-07-29 NOTE — Evaluation (Signed)
Physical Therapy Evaluation Patient Details Name: Jeremy Logan. MRN: [id number] DOB: [birth year] Today's Date: 07/29/2022  History of Present Illness  Pt is a 62 y/o M admitted on 07/28/22 after presenting with AMS. Pt is being treated for PNA. PMH: lung & renal CA, DM2, COPD, DVT/PE, obesity, bipolar disorder  Clinical Impression  Pt seen for PT evaluation with pt received in bed, agreeable to tx. Pt notes prior to admission he was living with a roommate in mobile home with 3 steps with L rail to enter, was still driving, & ambulating independently without AD. Pt notes he's not on supplemental O2 at baseline. On this date, pt is independent with bed mobility, transfers, & gait around the unit without AD. Pt on supplemental O2 with SpO2 >90%. Pt without overt LOB during session. Pt does not demonstrate acute PT needs at this time. PT educated pt on need to continue mobilizing with nursing staff & limit time in bed, pt voiced understanding. PT to complete current orders at this time, please re-consult if new needs arise.    Recommendations for follow up therapy are one component of a multi-disciplinary discharge planning process, led by the attending physician.  Recommendations may be updated based on patient status, additional functional criteria and insurance authorization.  Follow Up Recommendations       Assistance Recommended at Discharge PRN  Patient can return home with the following  Assistance with cooking/housework    Equipment Recommendations None recommended by PT  Recommendations for Other Services       Functional Status Assessment Patient has not had a recent decline in their functional status     Precautions / Restrictions Precautions Precautions: None Restrictions Weight Bearing Restrictions: No      Mobility  Bed Mobility Overal bed mobility: Independent             General bed mobility comments: supine>sit, HOB slightly elevated but pt not reliant on  it    Transfers Overall transfer level: Independent Equipment used: None               General transfer comment: STS from EOB, recliner    Ambulation/Gait Ambulation/Gait assistance: Independent Gait Distance (Feet): 150 Feet Assistive device: None   Gait velocity: slightly decreased     General Gait Details: increased weight shift to L  Stairs            Wheelchair Mobility    Modified Rankin (Stroke Patients Only)       Balance Overall balance assessment: Needs assistance Sitting-balance support: Feet supported Sitting balance-Leahy Scale: Normal     Standing balance support: During functional activity, No upper extremity supported Standing balance-Leahy Scale: Good                               Pertinent Vitals/Pain Pain Assessment Pain Assessment: Faces Faces Pain Scale: Hurts even more Pain Location: L side of abdomen (notes this is chronic pain, pt has "bulge" 2/2 hx of renal CA) Pain Descriptors / Indicators: Discomfort Pain Intervention(s): Monitored during session, Patient requesting pain meds-RN notified    Home Living Family/patient expects to be discharged to:: Private residence Living Arrangements: Non-relatives/Friends (roommate) Available Help at Discharge: Friend(s);Available PRN/intermittently Type of Home: Mobile home Home Access: Stairs to enter Entrance Stairs-Rails: Left Entrance Stairs-Number of Steps: 3   Home Layout: One level        Prior Function Prior Level of Function : Independent/Modified  Independent;Driving             Mobility Comments: Independent without AD       Hand Dominance        Extremity/Trunk Assessment   Upper Extremity Assessment Upper Extremity Assessment: Overall WFL for tasks assessed    Lower Extremity Assessment Lower Extremity Assessment: Overall WFL for tasks assessed    Cervical / Trunk Assessment Cervical / Trunk Assessment:  (Pt notes chronic "bulge" on L  side of trunk/abdomen but notes this is 2/2 hx of renal CA)  Communication   Communication: No difficulties  Cognition Arousal/Alertness: Awake/alert Behavior During Therapy: WFL for tasks assessed/performed Overall Cognitive Status: Within Functional Limits for tasks assessed                                 General Comments: Pleasant gentleman, appreciative of session        General Comments General comments (skin integrity, edema, etc.): Pt on 5L/min via nasal cannula at rest, 6L/min with gait, SpO2 >90% throughout. Pt without c/o SOB but PT educated pt on pursed lip breathing. HR after gait 125 bpm.    Exercises     Assessment/Plan    PT Assessment Patient does not need any further PT services  PT Problem List         PT Treatment Interventions      PT Goals (Current goals can be found in the Care Plan section)  Acute Rehab PT Goals Patient Stated Goal: get better PT Goal Formulation: With patient Time For Goal Achievement: 08/12/22 Potential to Achieve Goals: Good    Frequency       Co-evaluation               AM-PAC PT "6 Clicks" Mobility  Outcome Measure Help needed turning from your back to your side while in a flat bed without using bedrails?: None Help needed moving from lying on your back to sitting on the side of a flat bed without using bedrails?: None Help needed moving to and from a bed to a chair (including a wheelchair)?: None Help needed standing up from a chair using your arms (e.g., wheelchair or bedside chair)?: None Help needed to walk in hospital room?: None Help needed climbing 3-5 steps with a railing? : None 6 Click Score: 24    End of Session Equipment Utilized During Treatment: Oxygen Activity Tolerance: Patient tolerated treatment well Patient left: in chair;with call bell/phone within reach Nurse Communication: Patient requests pain meds      Time: 1610-9604 PT Time Calculation (min) (ACUTE ONLY): 13  min   Charges:   PT Evaluation $PT Eval Low Complexity: 1 Low          Aleda Grana, PT, DPT 07/29/22, 8:42 AM   Sandi Mariscal 07/29/2022, 8:40 AM

## 2022-07-29 NOTE — TOC Progression Note (Signed)
Transition of Care (TOC) - Progression Note    Patient Details  Name: Burman Madrazo. MRN: [id number] Date of Birth: 1960/05/31  Transition of Care [facility]) CM/SW Contact  Janae Bridgeman, RN Phone Number: 07/29/2022, 3:19 PM  Clinical Narrative:      Transition of Care [facility]) Screening Note   Patient Details  Name: Ladarris Iha. Date of Birth: [birth year]   Transition of Care [facility]) CM/SW Contact:    Janae Bridgeman, RN Phone Number: 07/29/2022, 3:19 PM    Transition of Care Department [facility]) has reviewed patient and no TOC needs have been identified at this time.   We will continue to monitor patient advancement through interdisciplinary progression rounds. If new patient transition needs arise, please place a TOC consult.    Expected Discharge Plan: Home w Home Health Services Barriers to Discharge: Continued Medical Work up  Expected Discharge Plan and Services       Living arrangements for the past 2 months: Mobile Home                                       Social Determinants of Health (SDOH) Interventions SDOH Screenings   Food Insecurity: No Food Insecurity (07/28/2022)  Housing: Low Risk  (07/28/2022)  Transportation Needs: No Transportation Needs (07/28/2022)  Utilities: Not At Risk (07/28/2022)  Tobacco Use: Medium Risk (07/28/2022)    Readmission Risk Interventions     No data to display

## 2022-07-30 DIAGNOSIS — J189 Pneumonia, unspecified organism: Secondary | ICD-10-CM | POA: Diagnosis not present

## 2022-07-30 DIAGNOSIS — J9601 Acute respiratory failure with hypoxia: Secondary | ICD-10-CM | POA: Diagnosis not present

## 2022-07-30 LAB — CBC
HCT: 36.1 % — ABNORMAL LOW (ref 39.0–52.0)
Hemoglobin: 11.9 g/dL — ABNORMAL LOW (ref 13.0–17.0)
MCH: 29.2 pg (ref 26.0–34.0)
MCHC: 33 g/dL (ref 30.0–36.0)
MCV: 88.5 fL (ref 80.0–100.0)
Platelets: 256 10*3/uL (ref 150–400)
RBC: 4.08 MIL/uL — ABNORMAL LOW (ref 4.22–5.81)
RDW: 15 % (ref 11.5–15.5)
WBC: 14.7 10*3/uL — ABNORMAL HIGH (ref 4.0–10.5)
nRBC: 0 % (ref 0.0–0.2)

## 2022-07-30 LAB — GLUCOSE, CAPILLARY
Glucose-Capillary: 159 mg/dL — ABNORMAL HIGH (ref 70–99)
Glucose-Capillary: 106 mg/dL — ABNORMAL HIGH (ref 70–99)
Glucose-Capillary: 139 mg/dL — ABNORMAL HIGH (ref 70–99)
Glucose-Capillary: 87 mg/dL (ref 70–99)

## 2022-07-30 LAB — CULTURE, BLOOD (ROUTINE X 2): Special Requests: ADEQUATE

## 2022-07-30 MED ORDER — ENOXAPARIN SODIUM 60 MG/0.6ML IJ SOSY
60.0000 mg | PREFILLED_SYRINGE | INTRAMUSCULAR | Status: DC
  Administered 2022-07-30: 60 mg via SUBCUTANEOUS
  Filled 2022-07-30: qty 0.6

## 2022-07-30 MED ORDER — IPRATROPIUM-ALBUTEROL 0.5-2.5 (3) MG/3ML IN SOLN
3.0000 mL | Freq: Three times a day (TID) | RESPIRATORY_TRACT | Status: DC
  Administered 2022-07-30 – 2022-07-31 (×5): 3 mL via RESPIRATORY_TRACT
  Filled 2022-07-30 (×5): qty 3

## 2022-07-30 NOTE — Progress Notes (Signed)
PROGRESS NOTE    Jeremy Logan.  ZOX:[id number] DOB: 16-Oct-1960 DOA: 07/28/2022 PCP: Olive Bass, MD   Brief Narrative: Jeremy Putnam Elisha Montel. is a 62 y.o. male with history of diabetes mellitus type 2, lung and renal cell cancer, COPD, DVT/PE, obesity, bipolar disorder.  Patient presented secondary to worsening dyspnea and cough and was found to have evidence of right lower lobe/multifocal pneumonia with associated sepsis.  Patient started on empiric ceftriaxone/azithromycin and blood cultures obtained.  Hospitalization complicated by acute respiratory failure secondary to pneumonia requiring supplemental oxygen.   Assessment and Plan:  Severe sepsis Present on admission with associated acute metabolic encephalopathy.  Blood cultures obtained on admission and are no growth to date.  Patient started empiric ceftriaxone azithromycin for treatment of pneumonia as source.  Right lower lobe pneumonia Multifocal pneumonia Noted on CT imaging. Patient started on azithromycin.  Complicated by new oxygen requirement this admission. -Continue ceftriaxone/azithromycin  Acute respiratory failure with hypoxia Secondary to pneumonia. Per EMS report, patient was hypoxic on their arrival requiring placement of oxygen. Patient requiring up to 5 L/min of supplemental oxygen.  Patient weaned down to 4 L/min of supplemental diet. -Patient is able -Ambulatory pulse ox daily -Continuous pulse ox  Paratracheal, subcarinal and hilar lymph nodes Noted on chest CT. Presumed inflammatory secondary to acute infection. Complicated by patient's history of lung cancer. Radiology recommendation for follow-up CT scan in 3-4 weeks to ensure resolution/stability.  Chronic diastolic heart failure Stable. No evidence of exacerbation.  Primary hypertension -Continue Coreg  Diabetes mellitus type 2 Well controlled. Patient is managed on metformin and dulaglutide. Hemoglobin A1C of 6.1%. -Continue  SSI  Hyperlipidemia -Continue Crestor  COPD Stable. Likely contributing to respiratory failure -Continue Duoneb  Stage IIIa non-small cell lung cancer Stage I renal cell carcinoma Noted.  Patient follows with Dr. Gilman Buttner, oncology as an outpatient.  Bipolar disorder -Continue Zyprexa, Wellbutrin, Prozac  Chronic pain -Continue Lyrica and resume home hydromorphone  Obesity Estimated body mass index is 36.96 kg/m as calculated from the following:   Height as of this encounter: 5\' 11"  (1.803 m).   Weight as of this encounter: 120.2 kg.   DVT prophylaxis: Lovenox Code Status:   Code Status: Full Code Family Communication: None at bedside Disposition Plan: Discharge pending improvement of oxygen requirement and transition to outpatient antibiotic regimen   Consultants:  None  Procedures:  None  Antimicrobials: Ceftriaxone IV Azithromycin   Subjective: Patient with dyspnea on exertion.  Overall feels better.  No chest pain.  Afebrile overnight.  Objective: BP 92/60 (BP Location: Left Arm)   Pulse 95   Temp 99.1 F (37.3 C) (Oral)   Resp 20   Ht 5\' 11"  (1.803 m)   Wt 120.2 kg   SpO2 96%   BMI 36.96 kg/m   Examination:  General exam: Appears calm and comfortable Respiratory system: Transmitted upper airway sounds appears to sound. Respiratory effort normal. Cardiovascular system: S1 & S2 heard, RRR. No murmurs, rubs, gallops or clicks. Gastrointestinal system: Abdomen is nondistended, soft and nontender. Normal bowel sounds heard. Central nervous system: Alert and oriented. No focal neurological deficits. Musculoskeletal: No calf tenderness Skin: No cyanosis. No rashes Psychiatry: Judgement and insight appear normal. Mood & affect appropriate.    Data Reviewed: I have personally reviewed following labs and imaging studies  CBC Lab Results  Component Value Date   WBC 14.7 (H) 07/30/2022   RBC 4.08 (L) 07/30/2022   HGB 11.9 (L) 07/30/2022  HCT 36.1  (L) 07/30/2022   MCV 88.5 07/30/2022   MCH 29.2 07/30/2022   PLT 256 07/30/2022   MCHC 33.0 07/30/2022   RDW 15.0 07/30/2022   LYMPHSABS 1.4 07/28/2022   MONOABS 1.3 (H) 07/28/2022   EOSABS 0.1 07/28/2022   BASOSABS 0.0 07/28/2022     Last metabolic panel Lab Results  Component Value Date   NA 136 07/29/2022   K 4.0 07/29/2022   CL 104 07/29/2022   CO2 24 07/29/2022   BUN 13 07/29/2022   CREATININE 1.04 07/29/2022   GLUCOSE 122 (H) 07/29/2022   GFRNONAA >60 07/29/2022   GFRAA  11/02/2008    >60        The eGFR has been calculated using the MDRD equation. This calculation has not been validated in all clinical situations. eGFR's persistently <60 mL/min signify possible Chronic Kidney Disease.   CALCIUM 8.9 07/29/2022   PROT 6.7 07/28/2022   ALBUMIN 3.4 (L) 07/28/2022   BILITOT 0.5 07/28/2022   ALKPHOS 52 07/28/2022   AST 21 07/28/2022   ALT 19 07/28/2022   ANIONGAP 8 07/29/2022    GFR: Estimated Creatinine Clearance: 97.2 mL/min (by C-G formula based on SCr of 1.04 mg/dL).  Recent Results (from the past 240 hour(s))  Culture, blood (routine x 2)     Status: None (Preliminary result)   Collection Time: 07/28/22  4:15 PM   Specimen: BLOOD LEFT ARM  Result Value Ref Range Status   Specimen Description BLOOD LEFT ARM  Final   Special Requests   Final    BOTTLES DRAWN AEROBIC AND ANAEROBIC Blood Culture adequate volume   Culture   Final    NO GROWTH 2 DAYS Performed at [facility], [address], [place], [place] [address]    Report Status PENDING  Incomplete  Culture, blood (routine x 2)     Status: None (Preliminary result)   Collection Time: 07/28/22  4:17 PM   Specimen: BLOOD RIGHT ARM  Result Value Ref Range Status   Specimen Description BLOOD RIGHT ARM  Final   Special Requests   Final    BOTTLES DRAWN AEROBIC AND ANAEROBIC Blood Culture adequate volume   Culture   Final    NO GROWTH 2 DAYS Performed at [facility], [address], [place], [place] [address]    Report Status PENDING  Incomplete      Radiology Studies: CT ANGIO CHEST AORTA W/CM & OR WO/CM  Result Date: 07/28/2022 CLINICAL DATA:  Chest pain. EXAM: CT ANGIOGRAPHY CHEST WITH CONTRAST TECHNIQUE: Multidetector CT imaging of the chest was performed using the standard protocol during bolus administration of intravenous contrast. Multiplanar CT image reconstructions and MIPs were obtained to evaluate the vascular anatomy. RADIATION DOSE REDUCTION: This exam was performed according to the departmental dose-optimization program which includes automated exposure control, adjustment of the mA and/or kV according to patient size and/or use of iterative reconstruction technique. CONTRAST:  OMNIPAQUE IOHEXOL 350 MG/ML SOLN COMPARISON:  June 02, 2022. FINDINGS: Cardiovascular: Preferential opacification of the thoracic aorta. No evidence of thoracic aortic aneurysm or dissection. Normal heart size. No pericardial effusion. Mediastinum/Nodes: Esophagus is unremarkable. Thyroid gland is unremarkable. 12 mm right paratracheal lymph node is noted which is enlarged compared to prior exam. Subcarinal adenopathy is noted with minor axis of 15 mm which is significantly enlarged compared to prior exam. Right hilar adenopathy measuring 14 mm is also noted. Lungs/Pleura: No pneumothorax or pleural effusion is noted. Patchy nodular  opacities are seen in both lungs, most prominently in the right lower lobe most likely due to pneumonia. Similar opacities are also seen in the left lower lobe and right upper lobe. Stable scarring or fibrosis is noted in the right upper lobe. Upper Abdomen: No acute abnormality. Musculoskeletal: Stable sclerotic density is seen involving T1 and T2 as well as posterior portion of right second rib most likely due to previous radiation treatment. No acute fracture is noted. Review of the MIP images confirms the above findings. IMPRESSION: No evidence of thoracic  aortic dissection or aneurysm. However, there is interval element of large opacity seen involving right lower lobe most consistent with pneumonia. Multiple smaller patchy airspace opacities are noted in the left lower lobe and right upper lobe most likely inflammatory in etiology, suggesting multifocal pneumonia. Enlarged right paratracheal, subcarinal and right hilar lymph nodes are noted which most likely are inflammatory in etiology given the presence of pneumonia, but follow-up CT scan in 3-4 weeks is recommended to ensure resolution or stability. Electronically Signed   By: Lupita Raider M.D.   On: 07/28/2022 15:51   CT HEAD CODE STROKE WO CONTRAST  Result Date: 07/28/2022 CLINICAL DATA:  Code stroke.  Neuro deficit, stroke suspected. EXAM: CT ANGIOGRAPHY HEAD AND NECK TECHNIQUE: Multidetector CT imaging of the head and neck was performed using the standard protocol during bolus administration of intravenous contrast. Multiplanar CT image reconstructions and MIPs were obtained to evaluate the vascular anatomy. Carotid stenosis measurements (when applicable) are obtained utilizing NASCET criteria, using the distal internal carotid diameter as the denominator. RADIATION DOSE REDUCTION: This exam was performed according to the departmental dose-optimization program which includes automated exposure control, adjustment of the mA and/or kV according to patient size and/or use of iterative reconstruction technique. CONTRAST:  OMNIPAQUE IOHEXOL 350 MG/ML SOLN COMPARISON:  CT head 10/21/2015 FINDINGS: CT HEAD FINDINGS Brain: There is no acute intracranial hemorrhage, extra-axial fluid collection, or acute infarct. Parenchymal volume is normal. The ventricles are normal in size. Gray-white differentiation is preserved. The pituitary and suprasellar region are normal. There is no mass lesion. There is no mass effect or midline shift. Vascular: No hyperdense vessel or unexpected calcification. Skull: Normal.  Negative for fracture or focal lesion. Sinuses/Orbits: The paranasal sinuses are clear. The globes and orbits are unremarkable. Other: None. ASPECTS [facility] Stroke Program Early CT Score) - Ganglionic level infarction (caudate, lentiform nuclei, internal capsule, insula, M1-M3 cortex): 7 - Supraganglionic infarction (M4-M6 cortex): 3 Total score (0-10 with 10 being normal): 10 CTA NECK FINDINGS Aortic arch: The imaged aortic arch is normal. The origins of the major branch vessels are patent. The subclavian arteries are patent to the level imaged. Right carotid system: The right common, internal, and external carotid arteries are patent, with mild plaque of the bifurcation but no hemodynamically significant stenosis or occlusion there is no evidence of dissection or aneurysm. Left carotid system: The left common, internal, and external carotid arteries are patent, with mild plaque of the bifurcation but no hemodynamically significant stenosis or occlusion. There is no evidence of dissection or aneurysm. Vertebral arteries: The vertebral arteries are patent, without hemodynamically significant stenosis or occlusion. There is no evidence of dissection or aneurysm. Skeleton: There is no acute osseous abnormality or suspicious osseous lesion. There is no visible canal hematoma. Other neck: The soft tissues of the neck are unremarkable. Upper chest: Assessed on the separately dictated CT chest. Review of the MIP images confirms the above findings CTA HEAD FINDINGS  Anterior circulation: There is mild calcified plaque in the intracranial ICAs without significant stenosis or occlusion. The bilateral MCAs are patent, without proximal stenosis or occlusion. The bilateral ACAs are patent, without proximal stenosis or occlusion. The anterior communicating artery is normal. There is no aneurysm or AVM. Posterior circulation: The bilateral V4 segments are patent. The basilar artery is patent. The major cerebellar arteries appear  patent. The bilateral PCAs are patent, without proximal stenosis or occlusion. Posterior communicating arteries are not identified. There is no aneurysm or AVM. Venous sinuses: Patent. Anatomic variants: None. Review of the MIP images confirms the above findings IMPRESSION: 1. No acute intracranial pathology. 2. Patent vasculature of the head and neck with no hemodynamically significant stenosis, occlusion, or dissection. Findings communicated to Dr. Amada Jupiter via Loretha Stapler at 3:25 pm. Electronically Signed   By: Lesia Hausen M.D.   On: 07/28/2022 15:39   CT ANGIO HEAD NECK W WO CM (CODE STROKE)  Result Date: 07/28/2022 CLINICAL DATA:  Code stroke.  Neuro deficit, stroke suspected. EXAM: CT ANGIOGRAPHY HEAD AND NECK TECHNIQUE: Multidetector CT imaging of the head and neck was performed using the standard protocol during bolus administration of intravenous contrast. Multiplanar CT image reconstructions and MIPs were obtained to evaluate the vascular anatomy. Carotid stenosis measurements (when applicable) are obtained utilizing NASCET criteria, using the distal internal carotid diameter as the denominator. RADIATION DOSE REDUCTION: This exam was performed according to the departmental dose-optimization program which includes automated exposure control, adjustment of the mA and/or kV according to patient size and/or use of iterative reconstruction technique. CONTRAST:  OMNIPAQUE IOHEXOL 350 MG/ML SOLN COMPARISON:  CT head 10/21/2015 FINDINGS: CT HEAD FINDINGS Brain: There is no acute intracranial hemorrhage, extra-axial fluid collection, or acute infarct. Parenchymal volume is normal. The ventricles are normal in size. Gray-white differentiation is preserved. The pituitary and suprasellar region are normal. There is no mass lesion. There is no mass effect or midline shift. Vascular: No hyperdense vessel or unexpected calcification. Skull: Normal. Negative for fracture or focal lesion. Sinuses/Orbits: The  paranasal sinuses are clear. The globes and orbits are unremarkable. Other: None. ASPECTS [facility] Stroke Program Early CT Score) - Ganglionic level infarction (caudate, lentiform nuclei, internal capsule, insula, M1-M3 cortex): 7 - Supraganglionic infarction (M4-M6 cortex): 3 Total score (0-10 with 10 being normal): 10 CTA NECK FINDINGS Aortic arch: The imaged aortic arch is normal. The origins of the major branch vessels are patent. The subclavian arteries are patent to the level imaged. Right carotid system: The right common, internal, and external carotid arteries are patent, with mild plaque of the bifurcation but no hemodynamically significant stenosis or occlusion there is no evidence of dissection or aneurysm. Left carotid system: The left common, internal, and external carotid arteries are patent, with mild plaque of the bifurcation but no hemodynamically significant stenosis or occlusion. There is no evidence of dissection or aneurysm. Vertebral arteries: The vertebral arteries are patent, without hemodynamically significant stenosis or occlusion. There is no evidence of dissection or aneurysm. Skeleton: There is no acute osseous abnormality or suspicious osseous lesion. There is no visible canal hematoma. Other neck: The soft tissues of the neck are unremarkable. Upper chest: Assessed on the separately dictated CT chest. Review of the MIP images confirms the above findings CTA HEAD FINDINGS Anterior circulation: There is mild calcified plaque in the intracranial ICAs without significant stenosis or occlusion. The bilateral MCAs are patent, without proximal stenosis or occlusion. The bilateral ACAs are patent, without proximal stenosis or occlusion. The  anterior communicating artery is normal. There is no aneurysm or AVM. Posterior circulation: The bilateral V4 segments are patent. The basilar artery is patent. The major cerebellar arteries appear patent. The bilateral PCAs are patent, without proximal  stenosis or occlusion. Posterior communicating arteries are not identified. There is no aneurysm or AVM. Venous sinuses: Patent. Anatomic variants: None. Review of the MIP images confirms the above findings IMPRESSION: 1. No acute intracranial pathology. 2. Patent vasculature of the head and neck with no hemodynamically significant stenosis, occlusion, or dissection. Findings communicated to Dr. Amada Jupiter via Loretha Stapler at 3:25 pm. Electronically Signed   By: Lesia Hausen M.D.   On: 07/28/2022 15:39      LOS: 2 days    Jacquelin Hawking, MD [facility] 07/30/2022, 1:17 PM   If 7PM-7AM, please contact night-coverage [email or web address]

## 2022-07-30 NOTE — TOC Initial Note (Signed)
Transition of Care (TOC) - Initial/Assessment Note    Patient Details  Name: Jeremy Logan. MRN: [id number] Date of Birth: 1960-12-30  Transition of Care [facility]) CM/SW Contact:    Janae Bridgeman, RN Phone Number: 07/30/2022, 12:42 PM  Clinical Narrative:                 CM met with the patient at the bedside to discuss TOC needs.  The patient lives with a roommate in a mobile home and admitted to the hospital with AMS, Septicemia and pneumonia.  The patient currently requires 5L/min [place] oxygen in the room.  The patient has no DME at the home at this time and currently drives.  The patient states that he currently Vapes.  Counseling provided at the bedside and follow up instructions regarding Vaping and lung injury included in the discharge AVS information for the patient.  The patient does not work and is permanently disabled.  The patient states that he receives a social security check monthly for 1900.00.  The patient plans to have family provide transportation to home by car when medically stable for discharge.  TOC Team will continue to follow the patient for discharge planning needs - patient is currently requiring IV antibiotics and 5L/min [place] oxygen at the bedside.  The patient has history of lung cancer and is not medically stable for discharge.  Expected Discharge Plan: Home/Self Care Barriers to Discharge: Continued Medical Work up   Patient Goals and CMS Choice Patient states their goals for this hospitalization and ongoing recovery are:: To get better CMS Medicare.gov Compare Post Acute Care list provided to:: Patient Choice offered to / list presented to : Patient [place] ownership interest in [facility].provided to:: Patient    Expected Discharge Plan and Services   Discharge Planning Services: CM Consult   Living arrangements for the past 2 months: Mobile Home                                      Prior Living  Arrangements/Services Living arrangements for the past 2 months: Mobile Home Lives with:: Roommate (Lives with friend, Jeremy Batten at the home) Patient language and need for interpreter reviewed:: Yes Do you feel safe going back to the place where you live?: Yes      Need for Family Participation in Patient Care: Yes (Comment) Care giver support system in place?: Yes (comment)   Criminal Activity/Legal Involvement Pertinent to Current Situation/Hospitalization: No - Comment as needed  Activities of Daily Living Home Assistive Devices/Equipment: None ADL Screening (condition at time of admission) Patient's cognitive ability adequate to safely complete daily activities?: Yes Is the patient deaf or have difficulty hearing?: No Does the patient have difficulty seeing, even when wearing glasses/contacts?: No Does the patient have difficulty concentrating, remembering, or making decisions?: Yes Patient able to express need for assistance with ADLs?: No Does the patient have difficulty dressing or bathing?: No Independently performs ADLs?: Yes (appropriate for developmental age) Does the patient have difficulty walking or climbing stairs?: No Weakness of Legs: None Weakness of Arms/Hands: Left (slight)  Permission Sought/Granted Permission sought to share information with : Case Manager                Emotional Assessment Appearance:: Appears stated age Attitude/Demeanor/Rapport: Gracious Affect (typically observed): Accepting Orientation: : Oriented to Self, Oriented to Place, Oriented to  Time, Oriented to Situation  Alcohol / Substance Use: Not Applicable (Patient actively "Vapes" at home)    Admission diagnosis:  Pneumonia [J18.9] Hypoxia [R09.02] Pneumonia of right lower lobe due to infectious organism [J18.9] Patient Active Problem List   Diagnosis Date Noted   Pneumonia 07/28/2022   Essential hypertension 07/28/2022   Encounter for care related to Port-a-Cath 04/03/2021    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/2021   Pain in both thighs 02/18/2021   Laxity of abdominal wall 10/17/2019   Substance abuse (HCC)    Diabetes mellitus without complication (HCC)    Depression    Bipolar disease, chronic (HCC)    Cancer of lung, upper lobe (HCC) 01/31/2019   Constipation due to opioid therapy 04/15/2016   Migraine headache 04/15/2016   Obesity (BMI 30.0-34.9) 04/15/2016   Acute pulmonary embolism (HCC) 11/13/2015   Deep vein thrombosis (DVT) of proximal vein of left lower extremity (HCC) 11/06/2015   COPD (chronic obstructive pulmonary disease) (HCC) 07/29/2015   Chronic diastolic heart failure (HCC) 07/17/2015   Type 2 diabetes mellitus with hyperlipidemia (HCC) 06/21/2015   Benign hypertension 06/12/2015   Mixed hyperlipidemia 06/12/2015   Malignant neoplasm of upper lobe of right lung (HCC) 06/12/2015   Osteoporosis 06/12/2015   Chronic pain syndrome 05/09/2015   Bipolar affective disorder, currently depressed, moderate (HCC) 05/08/2015   Recurrent major depressive disorder, in partial remission (HCC) 05/08/2015   Renal cell carcinoma (HCC) 03/2010   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 [place] [address] Phone: [phone] Fax: [phone]     Social Determinants of Health (SDOH) Social History: SDOH Screenings   Food Insecurity: No Food Insecurity (07/28/2022)  Housing: Low Risk  (07/28/2022)  Transportation Needs: No Transportation Needs (07/28/2022)  Utilities: Not At Risk (07/28/2022)  Tobacco Use: Medium Risk (07/28/2022)   SDOH Interventions: Housing Interventions: Patient Refused   Readmission Risk Interventions     No data to display

## 2022-07-31 DIAGNOSIS — J189 Pneumonia, unspecified organism: Secondary | ICD-10-CM | POA: Diagnosis not present

## 2022-07-31 LAB — CBC
HCT: 40 % (ref 39.0–52.0)
Hemoglobin: 12.4 g/dL — ABNORMAL LOW (ref 13.0–17.0)
MCH: 28.1 pg (ref 26.0–34.0)
MCHC: 31 g/dL (ref 30.0–36.0)
MCV: 90.5 fL (ref 80.0–100.0)
Platelets: 297 10*3/uL (ref 150–400)
RBC: 4.42 MIL/uL (ref 4.22–5.81)
RDW: 15.1 % (ref 11.5–15.5)
WBC: 13.7 10*3/uL — ABNORMAL HIGH (ref 4.0–10.5)
nRBC: 0 % (ref 0.0–0.2)

## 2022-07-31 LAB — CULTURE, BLOOD (ROUTINE X 2)
Culture: NO GROWTH
Culture: NO GROWTH

## 2022-07-31 LAB — GLUCOSE, CAPILLARY
Glucose-Capillary: 203 mg/dL — ABNORMAL HIGH (ref 70–99)
Glucose-Capillary: 97 mg/dL (ref 70–99)

## 2022-07-31 MED ORDER — AZITHROMYCIN 500 MG PO TABS: 500.0000 mg | ORAL_TABLET | Freq: Every day | ORAL | 0 refills | Status: AC

## 2022-07-31 MED ORDER — CEFDINIR 300 MG PO CAPS: 300.0000 mg | ORAL_CAPSULE | Freq: Two times a day (BID) | ORAL | 0 refills | Status: AC

## 2022-07-31 NOTE — TOC Transition Note (Signed)
Transition of Care [facility]) - CM/SW Discharge Note   Patient Details  Name: Jeremy Logan. MRN: [id number] Date of Birth: Jul 18, 1960  Transition of Care [facility]) CM/SW Contact:  Janae Bridgeman, RN Phone Number: 07/31/2022, 3:11 PM   Clinical Narrative:    Cm spoke with bedside nursing and the patient was able to transition to room air with no needs for supplemental oxygen for home.  The patient plans to return home with family.  Bedside nursing to provide discharge instructions at the bedside.  No home health needed per therapy notes.   Final next level of care: Home/Self Care Barriers to Discharge: Continued Medical Work up   Patient Goals and CMS Choice CMS Medicare.gov Compare Post Acute Care list provided to:: Patient Choice offered to / list presented to : Patient  Discharge Placement                         Discharge Plan and Services Additional resources added to the After Visit Summary for     Discharge Planning Services: CM Consult                                 Social Determinants of Health (SDOH) Interventions SDOH Screenings   Food Insecurity: No Food Insecurity (07/28/2022)  Housing: Low Risk  (07/28/2022)  Transportation Needs: No Transportation Needs (07/28/2022)  Utilities: Not At Risk (07/28/2022)  Tobacco Use: Medium Risk (07/28/2022)     Readmission Risk Interventions     No data to display

## 2022-07-31 NOTE — Care Management Important Message (Signed)
Important Message  Patient Details  Name: Jeremy Logan. MRN: [id number] Date of Birth: 01-01-1961   Medicare Important Message Given:  Yes     Jaquia Benedicto 07/31/2022, 3:12 PM

## 2022-07-31 NOTE — Discharge Instructions (Signed)
Jeremy Putnam [name],  You were in the hospital with pneumonia. This has improved with antibiotics. Please continue antibiotics on discharge and follow-up with your primary care physician. Thankfully, you do not need oxygen on discharge.

## 2022-07-31 NOTE — Progress Notes (Signed)
SATURATION QUALIFICATIONS: (This note is used to comply with regulatory documentation for home oxygen)  Patient Saturations on Room Air at Rest = 91%  Patient Saturations on Room Air while Ambulating = 89%  Patient Saturations on 1 Liters of oxygen while Ambulating = 90%  Please briefly explain why patient needs home oxygen:   Patient does not meet criteria for home oxygen. RN will inform MD.

## 2022-07-31 NOTE — Discharge Summary (Signed)
Physician Discharge Summary   Patient: Jeremy Logan. MRN: [id number] DOB: July 02, 1960  Admit date:     07/28/2022  Discharge date: 07/31/22  Discharge Physician: Jacquelin Hawking, MD   PCP: Olive Bass, MD   Recommendations at discharge:  Hospital follow-up with PCP Repeat chest CT in 3-4 weeks to ensure pneumonia is resolved and to follow-up on prominent lymph nodes seen on imaging  Discharge Diagnoses: Principal Problem:   Pneumonia Active Problems:   Chronic diastolic heart failure (HCC)   Essential hypertension   Type 2 diabetes mellitus with hyperlipidemia (HCC)   Renal cell carcinoma (HCC)   Cancer of lung, upper lobe (HCC)   Deep vein thrombosis (DVT) of proximal vein of left lower extremity (HCC)   Bipolar disease, chronic (HCC)   Obesity (BMI 30.0-34.9)  Resolved Problems:   * No resolved hospital problems. *  Hospital Course: Jeremy Logan. is a 62 y.o. male with history of diabetes mellitus type 2, lung and renal cell cancer, COPD, DVT/PE, obesity, bipolar disorder.  Patient presented secondary to worsening dyspnea and cough and was found to have evidence of right lower lobe/multifocal pneumonia with associated sepsis.  Patient started on empiric ceftriaxone/azithromycin and blood cultures obtained.  Hospitalization complicated by acute respiratory failure secondary to pneumonia requiring supplemental oxygen.  Assessment and Plan:   Severe sepsis Present on admission with associated acute metabolic encephalopathy.  Blood cultures obtained on admission and are no growth to date.  Patient started empiric ceftriaxone azithromycin for treatment of pneumonia as source.   Right lower lobe pneumonia Multifocal pneumonia Noted on CT imaging. Patient started on Ceftriaxone and azithromycin.  Complicated by new oxygen requirement this admission. Improvement of symptoms prior to discharge. Transition to Cefdinir and azithromycin to complete antibiotic course.    Acute respiratory failure with hypoxia Secondary to pneumonia. Per EMS report, patient was hypoxic on their arrival requiring placement of oxygen. Patient requiring up to 5 L/min of supplemental oxygen.  Patient weaned down to room air prior to discharge. Ambulatory oxygen walking test revealed no need for oxygen on discharge.   Paratracheal, subcarinal and hilar lymph nodes Noted on chest CT. Presumed inflammatory secondary to acute infection. Complicated by patient's history of lung cancer. Radiology recommendation for follow-up CT scan in 3-4 weeks to ensure resolution/stability.   Chronic diastolic heart failure Stable. No evidence of exacerbation.   Primary hypertension Continue Coreg   Diabetes mellitus type 2 Well controlled. Patient is managed on metformin, Farxiga and dulaglutide. Hemoglobin A1C of 6.1%. Continue outpatient regimen   Hyperlipidemia Continue Crestor   COPD Stable. Likely contributing to respiratory failure. Recommend patient follow-up with pulmonology. Discussed cessation of vaping.   Stage IIIa non-small cell lung cancer Stage I renal cell carcinoma Noted.  Patient follows with Dr. Gilman Buttner, oncology as an outpatient.   Bipolar disorder Continue Zyprexa, Wellbutrin, Prozac   Chronic pain Continue Lyrica and resume home hydromorphone   Obesity Estimated body mass index is 36.96 kg/m as calculated from the following:   Height as of this encounter: 5\' 11"  (1.803 m).   Weight as of this encounter: 120.2 kg.     Consultants: None Procedures performed: None  Disposition: Home Diet recommendation: Carb modified diet   DISCHARGE MEDICATION: Allergies as of 07/31/2022       Reactions   Atorvastatin Other (See Comments)   Other reaction(s): Myalgias (intolerance) 08/07/2021: prob not related        Medication List     TAKE these  medications    amitriptyline 50 MG tablet Commonly known as: ELAVIL Take 50 mg by mouth at bedtime.   aspirin  EC 81 MG tablet Take 81 mg by mouth daily.   azithromycin 500 MG tablet Commonly known as: ZITHROMAX Take 1 tablet (500 mg total) by mouth daily for 2 days.   buPROPion 150 MG 24 hr tablet Commonly known as: WELLBUTRIN XL Take 150 mg by mouth every morning.   carvedilol 3.125 MG tablet Commonly known as: COREG Take 3.125 mg by mouth 2 (two) times daily with a meal.   cefdinir 300 MG capsule Commonly known as: OMNICEF Take 1 capsule (300 mg total) by mouth 2 (two) times daily for 2 days.   Dulaglutide 4.5 MG/0.5ML Sopn Inject into the skin.   ezetimibe 10 MG tablet Commonly known as: ZETIA Take 10 mg by mouth daily.   Farxiga 10 MG Tabs tablet Generic drug: dapagliflozin propanediol Take 10 mg by mouth daily.   FLUoxetine 20 MG capsule Commonly known as: PROZAC Take 40 mg by mouth 2 (two) times daily.   furosemide 20 MG tablet Commonly known as: LASIX Take 20 mg by mouth daily.   HYDROmorphone 4 MG tablet Commonly known as: DILAUDID Take 4 mg by mouth every 6 (six) hours as needed for severe pain.   latanoprost 0.005 % ophthalmic solution Commonly known as: XALATAN Place 1 drop into both eyes at bedtime.   metFORMIN 1000 MG tablet Commonly known as: GLUCOPHAGE Take 1,000 mg by mouth daily with breakfast.   OLANZapine 10 MG tablet Commonly known as: ZYPREXA Take 10 mg by mouth at bedtime.   pregabalin 150 MG capsule Commonly known as: LYRICA Take 150 mg by mouth every 12 (twelve) hours.   rosuvastatin 40 MG tablet Commonly known as: CRESTOR Take 40 mg by mouth daily.   spironolactone 25 MG tablet Commonly known as: ALDACTONE Take 25 mg by mouth daily.        Follow-up Information     Dough, Doris Cheadle, MD. Schedule an appointment as soon as possible for a visit in 1 week(s).   Specialty: Family Medicine Why: For hospital follow-up Contact information: [address] [place] [address] [phone]                Discharge Exam: BP  113/67 (BP Location: Right Arm)   Pulse 93   Temp (!) 97.5 F (36.4 C) (Oral)   Resp 18   Ht 5\' 11"  (1.803 m)   Wt 120.2 kg   SpO2 94%   BMI 36.96 kg/m   General exam: Appears calm and comfortable Respiratory system: Coarse breath sounds. Respiratory effort normal. Cardiovascular system: S1 & S2 heard, RRR. No murmurs, rubs, gallops or clicks. Gastrointestinal system: Abdomen is nondistended, soft and nontender. Normal bowel sounds heard. Central nervous system: Alert and oriented. No focal neurological deficits. Musculoskeletal: No calf tenderness Skin: No cyanosis. No rashes Psychiatry: Judgement and insight appear normal. Mood & affect appropriate.   Condition at discharge: stable  The results of significant diagnostics from this hospitalization (including imaging, microbiology, ancillary and laboratory) are listed below for reference.   Imaging Studies: CT ANGIO CHEST AORTA W/CM & OR WO/CM  Result Date: 07/28/2022 CLINICAL DATA:  Chest pain. EXAM: CT ANGIOGRAPHY CHEST WITH CONTRAST TECHNIQUE: Multidetector CT imaging of the chest was performed using the standard protocol during bolus administration of intravenous contrast. Multiplanar CT image reconstructions and MIPs were obtained to evaluate the vascular anatomy. RADIATION DOSE REDUCTION: This exam was  performed according to the departmental dose-optimization program which includes automated exposure control, adjustment of the mA and/or kV according to patient size and/or use of iterative reconstruction technique. CONTRAST:  OMNIPAQUE IOHEXOL 350 MG/ML SOLN COMPARISON:  June 02, 2022. FINDINGS: Cardiovascular: Preferential opacification of the thoracic aorta. No evidence of thoracic aortic aneurysm or dissection. Normal heart size. No pericardial effusion. Mediastinum/Nodes: Esophagus is unremarkable. Thyroid gland is unremarkable. 12 mm right paratracheal lymph node is noted which is enlarged compared to prior exam. Subcarinal  adenopathy is noted with minor axis of 15 mm which is significantly enlarged compared to prior exam. Right hilar adenopathy measuring 14 mm is also noted. Lungs/Pleura: No pneumothorax or pleural effusion is noted. Patchy nodular opacities are seen in both lungs, most prominently in the right lower lobe most likely due to pneumonia. Similar opacities are also seen in the left lower lobe and right upper lobe. Stable scarring or fibrosis is noted in the right upper lobe. Upper Abdomen: No acute abnormality. Musculoskeletal: Stable sclerotic density is seen involving T1 and T2 as well as posterior portion of right second rib most likely due to previous radiation treatment. No acute fracture is noted. Review of the MIP images confirms the above findings. IMPRESSION: No evidence of thoracic aortic dissection or aneurysm. However, there is interval element of large opacity seen involving right lower lobe most consistent with pneumonia. Multiple smaller patchy airspace opacities are noted in the left lower lobe and right upper lobe most likely inflammatory in etiology, suggesting multifocal pneumonia. Enlarged right paratracheal, subcarinal and right hilar lymph nodes are noted which most likely are inflammatory in etiology given the presence of pneumonia, but follow-up CT scan in 3-4 weeks is recommended to ensure resolution or stability. Electronically Signed   By: Lupita Raider M.D.   On: 07/28/2022 15:51   CT HEAD CODE STROKE WO CONTRAST  Result Date: 07/28/2022 CLINICAL DATA:  Code stroke.  Neuro deficit, stroke suspected. EXAM: CT ANGIOGRAPHY HEAD AND NECK TECHNIQUE: Multidetector CT imaging of the head and neck was performed using the standard protocol during bolus administration of intravenous contrast. Multiplanar CT image reconstructions and MIPs were obtained to evaluate the vascular anatomy. Carotid stenosis measurements (when applicable) are obtained utilizing NASCET criteria, using the distal internal  carotid diameter as the denominator. RADIATION DOSE REDUCTION: This exam was performed according to the departmental dose-optimization program which includes automated exposure control, adjustment of the mA and/or kV according to patient size and/or use of iterative reconstruction technique. CONTRAST:  OMNIPAQUE IOHEXOL 350 MG/ML SOLN COMPARISON:  CT head 10/21/2015 FINDINGS: CT HEAD FINDINGS Brain: There is no acute intracranial hemorrhage, extra-axial fluid collection, or acute infarct. Parenchymal volume is normal. The ventricles are normal in size. Gray-white differentiation is preserved. The pituitary and suprasellar region are normal. There is no mass lesion. There is no mass effect or midline shift. Vascular: No hyperdense vessel or unexpected calcification. Skull: Normal. Negative for fracture or focal lesion. Sinuses/Orbits: The paranasal sinuses are clear. The globes and orbits are unremarkable. Other: None. ASPECTS [facility] Stroke Program Early CT Score) - Ganglionic level infarction (caudate, lentiform nuclei, internal capsule, insula, M1-M3 cortex): 7 - Supraganglionic infarction (M4-M6 cortex): 3 Total score (0-10 with 10 being normal): 10 CTA NECK FINDINGS Aortic arch: The imaged aortic arch is normal. The origins of the major branch vessels are patent. The subclavian arteries are patent to the level imaged. Right carotid system: The right common, internal, and external carotid arteries are patent, with  mild plaque of the bifurcation but no hemodynamically significant stenosis or occlusion there is no evidence of dissection or aneurysm. Left carotid system: The left common, internal, and external carotid arteries are patent, with mild plaque of the bifurcation but no hemodynamically significant stenosis or occlusion. There is no evidence of dissection or aneurysm. Vertebral arteries: The vertebral arteries are patent, without hemodynamically significant stenosis or occlusion. There is no  evidence of dissection or aneurysm. Skeleton: There is no acute osseous abnormality or suspicious osseous lesion. There is no visible canal hematoma. Other neck: The soft tissues of the neck are unremarkable. Upper chest: Assessed on the separately dictated CT chest. Review of the MIP images confirms the above findings CTA HEAD FINDINGS Anterior circulation: There is mild calcified plaque in the intracranial ICAs without significant stenosis or occlusion. The bilateral MCAs are patent, without proximal stenosis or occlusion. The bilateral ACAs are patent, without proximal stenosis or occlusion. The anterior communicating artery is normal. There is no aneurysm or AVM. Posterior circulation: The bilateral V4 segments are patent. The basilar artery is patent. The major cerebellar arteries appear patent. The bilateral PCAs are patent, without proximal stenosis or occlusion. Posterior communicating arteries are not identified. There is no aneurysm or AVM. Venous sinuses: Patent. Anatomic variants: None. Review of the MIP images confirms the above findings IMPRESSION: 1. No acute intracranial pathology. 2. Patent vasculature of the head and neck with no hemodynamically significant stenosis, occlusion, or dissection. Findings communicated to Dr. Amada Jupiter via Loretha Stapler at 3:25 pm. Electronically Signed   By: Lesia Hausen M.D.   On: 07/28/2022 15:39   CT ANGIO HEAD NECK W WO CM (CODE STROKE)  Result Date: 07/28/2022 CLINICAL DATA:  Code stroke.  Neuro deficit, stroke suspected. EXAM: CT ANGIOGRAPHY HEAD AND NECK TECHNIQUE: Multidetector CT imaging of the head and neck was performed using the standard protocol during bolus administration of intravenous contrast. Multiplanar CT image reconstructions and MIPs were obtained to evaluate the vascular anatomy. Carotid stenosis measurements (when applicable) are obtained utilizing NASCET criteria, using the distal internal carotid diameter as the denominator. RADIATION DOSE  REDUCTION: This exam was performed according to the departmental dose-optimization program which includes automated exposure control, adjustment of the mA and/or kV according to patient size and/or use of iterative reconstruction technique. CONTRAST:  OMNIPAQUE IOHEXOL 350 MG/ML SOLN COMPARISON:  CT head 10/21/2015 FINDINGS: CT HEAD FINDINGS Brain: There is no acute intracranial hemorrhage, extra-axial fluid collection, or acute infarct. Parenchymal volume is normal. The ventricles are normal in size. Gray-white differentiation is preserved. The pituitary and suprasellar region are normal. There is no mass lesion. There is no mass effect or midline shift. Vascular: No hyperdense vessel or unexpected calcification. Skull: Normal. Negative for fracture or focal lesion. Sinuses/Orbits: The paranasal sinuses are clear. The globes and orbits are unremarkable. Other: None. ASPECTS [facility] Stroke Program Early CT Score) - Ganglionic level infarction (caudate, lentiform nuclei, internal capsule, insula, M1-M3 cortex): 7 - Supraganglionic infarction (M4-M6 cortex): 3 Total score (0-10 with 10 being normal): 10 CTA NECK FINDINGS Aortic arch: The imaged aortic arch is normal. The origins of the major branch vessels are patent. The subclavian arteries are patent to the level imaged. Right carotid system: The right common, internal, and external carotid arteries are patent, with mild plaque of the bifurcation but no hemodynamically significant stenosis or occlusion there is no evidence of dissection or aneurysm. Left carotid system: The left common, internal, and external carotid arteries are patent, with mild plaque of  the bifurcation but no hemodynamically significant stenosis or occlusion. There is no evidence of dissection or aneurysm. Vertebral arteries: The vertebral arteries are patent, without hemodynamically significant stenosis or occlusion. There is no evidence of dissection or aneurysm. Skeleton: There is no  acute osseous abnormality or suspicious osseous lesion. There is no visible canal hematoma. Other neck: The soft tissues of the neck are unremarkable. Upper chest: Assessed on the separately dictated CT chest. Review of the MIP images confirms the above findings CTA HEAD FINDINGS Anterior circulation: There is mild calcified plaque in the intracranial ICAs without significant stenosis or occlusion. The bilateral MCAs are patent, without proximal stenosis or occlusion. The bilateral ACAs are patent, without proximal stenosis or occlusion. The anterior communicating artery is normal. There is no aneurysm or AVM. Posterior circulation: The bilateral V4 segments are patent. The basilar artery is patent. The major cerebellar arteries appear patent. The bilateral PCAs are patent, without proximal stenosis or occlusion. Posterior communicating arteries are not identified. There is no aneurysm or AVM. Venous sinuses: Patent. Anatomic variants: None. Review of the MIP images confirms the above findings IMPRESSION: 1. No acute intracranial pathology. 2. Patent vasculature of the head and neck with no hemodynamically significant stenosis, occlusion, or dissection. Findings communicated to Dr. Amada Jupiter via Loretha Stapler at 3:25 pm. Electronically Signed   By: Lesia Hausen M.D.   On: 07/28/2022 15:39    Microbiology: Results for orders placed or performed during the hospital encounter of 07/28/22  Culture, blood (routine x 2)     Status: None (Preliminary result)   Collection Time: 07/28/22  4:15 PM   Specimen: BLOOD LEFT ARM  Result Value Ref Range Status   Specimen Description BLOOD LEFT ARM  Final   Special Requests   Final    BOTTLES DRAWN AEROBIC AND ANAEROBIC Blood Culture adequate volume   Culture   Final    NO GROWTH 3 DAYS Performed at [facility], [address] [place], [place] [address]    Report Status PENDING  Incomplete  Culture, blood (routine x 2)     Status: None (Preliminary result)    Collection Time: 07/28/22  4:17 PM   Specimen: BLOOD RIGHT ARM  Result Value Ref Range Status   Specimen Description BLOOD RIGHT ARM  Final   Special Requests   Final    BOTTLES DRAWN AEROBIC AND ANAEROBIC Blood Culture adequate volume   Culture   Final    NO GROWTH 3 DAYS Performed at [facility], [address], [place], [place] [address]    Report Status PENDING  Incomplete    Labs: CBC: Recent Labs  Lab 07/28/22 1515 07/28/22 1520 07/28/22 1559 07/28/22 2025 07/29/22 0336 07/30/22 0747 07/31/22 0723  WBC 16.5*  --   --  16.9* 19.1* 14.7* 13.7*  NEUTROABS 13.6*  --   --   --   --   --   --   HGB 13.1   < > 13.9 13.6 12.9* 11.9* 12.4*  HCT 40.8   < > 41.0 42.3 40.5 36.1* 40.0  MCV 91.7  --   --  90.8 90.4 88.5 90.5  PLT 230  --   --  240 270 256 297   < > = values in this interval not displayed.   Basic Metabolic Panel: Recent Labs  Lab 07/28/22 1515 07/28/22 1520 07/28/22 1559 07/28/22 2025 07/29/22 0336  NA 135 137 136  --  136  K 4.0 4.1 4.1  --  4.0  CL 102 102  --   --  104  CO2 22  --   --   --  24  GLUCOSE 161* 157*  --   --  122*  BUN 20 22  --   --  13  CREATININE 1.20 1.30*  --  1.02 1.04  CALCIUM 8.7*  --   --   --  8.9   Liver Function Tests: Recent Labs  Lab 07/28/22 1515  AST 21  ALT 19  ALKPHOS 52  BILITOT 0.5  PROT 6.7  ALBUMIN 3.4*   CBG: Recent Labs  Lab 07/30/22 1159 07/30/22 1652 07/30/22 2059 07/31/22 0828 07/31/22 1200  GLUCAP 106* 139* 87 203* 97    Discharge time spent: 35 minutes.  Signed: Jacquelin Hawking, MD [facility] 07/31/2022

## 2022-08-01 LAB — CULTURE, BLOOD (ROUTINE X 2)

## 2022-08-02 LAB — CULTURE, BLOOD (ROUTINE X 2): Special Requests: ADEQUATE

## 2022-09-08 ENCOUNTER — Encounter: Payer: Self-pay | Admitting: Oncology

## 2022-09-08 ENCOUNTER — Telehealth: Payer: Self-pay | Admitting: Oncology

## 2022-09-08 ENCOUNTER — Inpatient Hospital Stay: Payer: Medicare Other | Attending: Oncology | Admitting: Oncology

## 2022-09-08 ENCOUNTER — Other Ambulatory Visit: Payer: Self-pay | Admitting: Oncology

## 2022-09-08 VITALS — BP 134/69 | HR 81 | Temp 97.8°F | Resp 19 | Ht 71.0 in | Wt 248.4 lb

## 2022-09-08 DIAGNOSIS — C642 Malignant neoplasm of left kidney, except renal pelvis: Secondary | ICD-10-CM | POA: Diagnosis not present

## 2022-09-08 DIAGNOSIS — C3411 Malignant neoplasm of upper lobe, right bronchus or lung: Secondary | ICD-10-CM | POA: Diagnosis not present

## 2022-09-08 NOTE — Progress Notes (Signed)
[facility]  [address] [place],  [place]  [address] [phone]  Clinic Day: 09/08/22   Referring physician: Olive Bass, MD   CHIEF COMPLAINT:  CC: History of multiple malignancies, most recently a stage I non small cell lung cancer in the right upper lobe  Current Treatment:  Surveillance  HISTORY OF PRESENT ILLNESS:  Xavier Victorian. is a 62 y.o. male with a history of stage IIIA non-small cell lung cancer diagnosed in August of 2010 when he presented with a large Pancoast tumor measuring over 8 cm in diameter with destruction of T1 and T2 vertebral bodies.  He received concurrent chemotherapy and radiation with weekly carboplatin and paclitaxel and completed 8 cycles of the chemotherapy.  He has never had any evidence of recurrence, but continues to have severe pain of the right shoulder and neck, which is felt to be from long-term neurologic damage.  He was incidentally found to have a stage I renal cell carcinoma which was treated with a partial left nephrectomy in January 2012 at [facility], and he has not had any recurrence of that either.  However, he has chronic pain of pain in the left flank where his abdomen bulges from his surgery.  He also had an umbilical hernia which became incarcerated and required surgery in 2012. He had opioid dependence, but was tapered off his narcotics last year.  He is not a candidate for methadone because of his other medications.  He also has a diagnosis of depression and bipolar disease.  CT chest in March 2016 revealed no evidence of malignancy, but he had extensive damage to the upper thoracic spine from his prior malignancy.  He was extremely short of breath when he saw Dr. Gilman Buttner in November 2016 and ended up hospitalized for acute respiratory failure for 7 weeks through January 2017.  He ended up with a temporary tracheostomy.  He has insulin dependent diabetic, likely from frequent steroids.   In August 2017, he had severe swelling of the left lower extremity and was found to have extensive acute deep venous thrombosis throughout most of the leg.  He was later seen in the emergency room due to dyspnea and CT a chest revealed pulmonary emboli with evidence of right heart strain.  He was hospitalized on intravenous heparin, he was switched to Lovenox upon discharge and later switched to Xarelto.  Repeat CTA chest in February 2018 revealed resolution of the pulmonary embolism.  As he had been on anticoagulation for 6 months, Xarelto was discontinued at that time.  He presented to the emergency room with dyspnea and chest pain again in November 2018. CTA chest did not reveal any evidence of pulmonary embolism.  There was a new 7 mm right upper lobe nodule.  Repeat CT imaging in March 2019 revealed new mild mediastinal and bilateral hilar adenopathy measuring up to 1.4 cm in diameter.  An AP window lymph node had increased from 8 mm to 13 mm.  There was enlargement of spiculated nodule in the anterior right upper lobe measuring 1.5 cm, compared to 0.9 cm previously.  There were stable post radiation changes in the right upper paramediastinal area.  There were radiation changes of the right posterior 2nd rib and T2 vertebral body.  The CEA was up to 6.2.  He underwent PET scan for further evaluation.  This did not reveal hypermetabolic activity in the right upper lobe measuring 14 mm.  There was a focus of  consolidation in the right upper lobe suggestive of radiation changes, also without hypermetabolic activity.  No hypermetabolic activity was seen in the mediastinal lymph nodes.  When he was seen in May of 2019, the CEA had gone back down to 3.2.  CT chest in July revealed a slight decrease in the right upper lobe pulmonary nodule, from 1.5 cm to 1.3 cm. Repeat CT chest in October revealed further decrease of the right upper lobe nodule, now measuring 1.1 cm, as well as a 9 mm subcarinal node, which is not  pathologically enlarged.  He quit smoking about 2 years ago.  Unfortunately, he does vape.  His CT scan from June 16th 2020 revealed that the anterior right lobe pulmonary nodule had progressed in the interva.  CT scan from October 16th 2020 revealed an interval increase in size of a spiculated nodule of the anterior right upper lobe measuring 1.8 x 0.9 cm, previously 0.9 x 0.7 cm, highly concerning for recurrent or metachronous primary lung malignancy.  He then underwent a PET scan which showed the 1.8 cm spiculated right upper lobe nodule, suspicious for recurrent or metachronous primary bronchogenic neoplasm, less likely metastasis.  The SUV was measured to be 6.1.  His CEA continues to be within the normal range, the last being 1.9.  He underwent a CT guided biopsy on the isolated right lung nodule on November 10th.  This is consistent with a non-small cell type, favor squamous cell.  This is likely a second primary lung cancer, but appears to be a stage I.  The original pathology from 10 years ago was read as large cell lung carcinoma.  Since this is an isolated nodule, I would not recommend chemotherapy and he is not a good candidate for surgical resection.  He was treated with stereotactic radiation at [facility].  PFT's are consistent with asthma among other diagnoses.  He completed stereotactic radiation at [facility] in [place] in December 2020.  CT imaging from March 2021 revealed slight interval decrease in the right upper lobe nodule now measuring 1.8 x 1.0 cm, previously 2.1 x 1.1 cm.  CT chest from August 2021 revealed increased soft tissue density at the site of anterior right upper lobe treated pulmonary nodule, favored to be secondary to evolving radiation change.  CT imaging from February 2022 were stable.  INTERVAL HISTORY:  Beauford is here for routine follow up for multiple malignancies, most recently a stage I non small cell lung cancer in the right upper lobe, and a remote history of a  non-small cell lung cancer in the apex of his lung with extension to bone. He states that he is now being seen at the pain clinic. He was hospitalized 2 weeks ago for 4 days for bilateral pneumonia, his oxygen saturation was 72%, he was on 6 liters of oxygen for 2 days on admission, and he was in and out of consciousness. I advised his sister to take a course on CPR so as to know what to do in case of an emergency. His sister says his glucose was around 400mg  during the previous episode of pneumonia and I told her it was his body responding to the stressful situation. His subsequent blood sugars were under good control. His HbA1c on 07/28/2022 was 6. Patient states that He feels well now but complains of chronic pain in his left side which is an 8/10. He has had a radical left nephrectomy for stage I renal cell carcinoma, and developed a subsequent hernia  in the area. He has no cough or chest pain. He  had a CT chest done on 09/07/22 which revealed slight decrease in size of enlarged mediastinal lymph nodes, which were probably due to his pneumonia, and his CEA in 06/02/2022 was 1.2 which was normal. I will see him back in 3 months with CBC, CMP, and CEA. We will probably re-scan him in 6 months if he continues to do well.  He denies signs of infection such as sore throat, sinus drainage, cough, or urinary symptoms.  He denies fevers or recurrent chills. He denies pain. He denies nausea, vomiting, chest pain, dyspnea or cough. His appetite is great and his weight has decreased by 16 pounds over last 6 weeks . His sister was with him at today's visit.  REVIEW OF SYSTEMS:  Review of Systems  Constitutional: Negative.  Negative for appetite change, chills, fatigue, fever and unexpected weight change.  HENT:  Negative.    Eyes: Negative.   Respiratory: Negative.  Negative for chest tightness, cough, hemoptysis, shortness of breath and wheezing.   Cardiovascular: Negative.  Negative for chest pain, leg swelling  and palpitations.  Gastrointestinal: Negative.  Negative for abdominal distention, abdominal pain, blood in stool, constipation, diarrhea, nausea and vomiting.  Endocrine: Negative.   Genitourinary:  Negative for difficulty urinating, dysuria, frequency and hematuria.   Musculoskeletal:  Positive for flank pain (left, chronic). Negative for arthralgias, back pain, gait problem and myalgias.       Right shoulder pain, chronic  Skin: Negative.   Neurological: Negative.  Negative for dizziness, extremity weakness, gait problem, headaches, light-headedness, numbness, seizures and speech difficulty.  Hematological: Negative.   Psychiatric/Behavioral: Negative.  Negative for depression and sleep disturbance. The patient is not nervous/anxious.   All other systems reviewed and are negative.    VITALS:  Blood pressure 134/69, pulse 81, temperature 97.8 F (36.6 C), temperature source Oral, resp. rate 19, height 5\' 11"  (1.803 m), weight 248 lb 6.4 oz (112.7 kg), SpO2 96 %.  Wt Readings from Last 3 Encounters:  09/08/22 248 lb 6.4 oz (112.7 kg)  07/28/22 264 lb 15.9 oz (120.2 kg)  06/04/22 260 lb 1.6 oz (118 kg)    Body mass index is 34.64 kg/m.  Performance status (ECOG): 1 - Symptomatic but completely ambulatory  PHYSICAL EXAM:  Physical Exam Vitals and nursing note reviewed. Exam conducted with a chaperone present.  Constitutional:      General: He is not in acute distress.    Appearance: Normal appearance. He is normal weight.  HENT:     Head: Normocephalic and atraumatic.     Right Ear: Tympanic membrane, ear canal and external ear normal.     Left Ear: Tympanic membrane, ear canal and external ear normal.     Nose: Nose normal.     Mouth/Throat:     Mouth: Mucous membranes are moist.     Pharynx: Oropharynx is clear.  Eyes:     General: No scleral icterus.    Extraocular Movements: Extraocular movements intact.     Conjunctiva/sclera: Conjunctivae normal.     Pupils: Pupils  are equal, round, and reactive to light.  Cardiovascular:     Rate and Rhythm: Normal rate and regular rhythm.     Pulses: Normal pulses.     Heart sounds: Normal heart sounds. No murmur heard.    No friction rub. No gallop.  Pulmonary:     Effort: Pulmonary effort is normal. No respiratory distress.  Breath sounds: Examination of the right-lower field reveals rhonchi. Rhonchi present.     Comments: He has slight rhonchi in the right base Abdominal:     General: Bowel sounds are normal. There is no distension.     Palpations: Abdomen is soft. There is no hepatomegaly, splenomegaly or mass.     Tenderness: There is no abdominal tenderness.     Hernia: A hernia is present.     Comments: Scaring of the umbilicus and a hernia at the left flank  Musculoskeletal:        General: Normal range of motion.     Cervical back: Normal range of motion and neck supple.     Right lower leg: No edema.     Left lower leg: No edema.  Lymphadenopathy:     Cervical: No cervical adenopathy.  Skin:    General: Skin is warm and dry.  Neurological:     General: No focal deficit present.     Mental Status: He is alert and oriented to person, place, and time. Mental status is at baseline.  Psychiatric:        Mood and Affect: Mood normal.        Behavior: Behavior normal.        Thought Content: Thought content normal.        Judgment: Judgment normal.     LABS:      Latest Ref Rng & Units 07/31/2022    7:23 AM 07/30/2022    7:47 AM 07/29/2022    3:36 AM  CBC  WBC 4.0 - 10.5 K/uL 13.7  14.7  19.1   Hemoglobin 13.0 - 17.0 g/dL 74.2  59.5  63.8   Hematocrit 39.0 - 52.0 % 40.0  36.1  40.5   Platelets 150 - 400 K/uL 297  256  270       Latest Ref Rng & Units 07/29/2022    3:36 AM 07/28/2022    8:25 PM 07/28/2022    3:59 PM  CMP  Glucose 70 - 99 mg/dL 756     BUN 8 - 23 mg/dL 13     Creatinine 4.33 - 1.24 mg/dL 2.95  1.88    Sodium 416 - 145 mmol/L 136   136   Potassium 3.5 - 5.1 mmol/L 4.0   4.1    Chloride 98 - 111 mmol/L 104     CO2 22 - 32 mmol/L 24     Calcium 8.9 - 10.3 mg/dL 8.9        Lab Results  Component Value Date   CEA1 1.9 12/04/2021   CEA 1.2 06/02/2022   /  CEA  Date Value Ref Range Status  06/02/2022 1.2  Final  12/04/2021 1.9 0.0 - 4.7 ng/mL Final    Comment:    (NOTE)                             Nonsmokers          <3.9                             Smokers             <5.6 [employer] Electrochemiluminescence Immunoassay (ECLIA) Values obtained with different assay methods or kits cannot be used interchangeably.  Results cannot be interpreted as absolute evidence of the presence or absence of malignant disease. Performed At: [facility] [employer]  [address] [place], [place] [id number] Jolene Schimke MD WU:[id number]      STUDIES:   Exam: 09/07/2022 CT CHEST WITHOUT CONTRAST IMPRESSION: There is significant interval decrease in alveolar infiltrates in the right lower lobe. There is subtle residual prominence of interstitial markings in the right lower lobe suggesting residual interstitial pneumonia. There is interval resolution of infiltrate in the left lower lobe. Scaring in the right lung field has not changed.  There is slight decrease in size of enlarged mediastinal lymph nodes, possibly suggesting improving inflammatory process.  There are no new infiltrates. There is no pleural effusion or pneumothorax.    EXAM: 07/28/2022 CT ANGIOGRAPHY HEAD AND NECK  IMPRESSION: 1. No acute intracranial pathology. 2. Patent vasculature of the head and neck with no hemodynamically significant stenosis, occlusion, or dissection.  EXAM: 07/28/2022 CT ANGIOGRAPHY CHEST WITH CONTRAST IMPRESSION: No evidence of thoracic aortic dissection or aneurysm.  However, there is interval element of large opacity seen involving right lower lobe most consistent with pneumonia. Multiple smaller patchy airspace opacities are noted in the left lower lobe and  right upper lobe most likely inflammatory in etiology, suggesting multifocal pneumonia.  Enlarged right paratracheal, subcarinal and right hilar lymph nodes are noted which most likely are inflammatory in etiology given the presence of pneumonia, but follow-up CT scan in 3-4 weeks is recommended to ensure resolution or stability.      EXAM:06/02/22 CT CHEST WITH CONTRAST IMPRESSION: Interval resolution of the interstitial opacity and patchy airspace consolidation seen previously in the right middle lobe and anterior right lower lobe.  Cluster of nodularity in the posterior right lower lobe noted today including dominant 9 mm right lower lobe nodule. Imaging features are most likely infectious/inflammatory but close up follow up warranted. Upper normal mediastinal lymph nodes are larger than on the prior study but not pathologic by CT size criteria. 3.9 x 2.3 cm subtle focus of increased attenuation/enhancement in the subcapsular left liver along the gallbladder fossa. This is nonspecific and may be related to focal fatty sparing. MRI recommended Stable appearance of mixed density lesion in the manubrium and right posterior second rib/vertebral body. 3,3 x 2.2 cm soft tissue structure in the lower right para midline neck has been incompletely visualized. This is presumably the patients right submandibular gland although projects lower than typically seen.    Allergies:   Allergies  Allergen Reactions   Atorvastatin Other (See Comments)    Other reaction(s): Myalgias (intolerance)  08/07/2021: prob not related    Current Medications: Current Outpatient Medications  Medication Sig Dispense Refill   amitriptyline (ELAVIL) 50 MG tablet Take 50 mg by mouth at bedtime.     aspirin EC 81 MG tablet Take 81 mg by mouth daily.     buPROPion (WELLBUTRIN XL) 150 MG 24 hr tablet Take 150 mg by mouth every morning.     carvedilol (COREG) 3.125 MG tablet Take 3.125 mg by mouth 2 (two) times  daily with a meal.      Dulaglutide 4.5 MG/0.5ML SOPN Inject into the skin.     ezetimibe (ZETIA) 10 MG tablet Take 10 mg by mouth daily.     FARXIGA 10 MG TABS tablet Take 10 mg by mouth daily.     FLUoxetine (PROZAC) 20 MG capsule Take 40 mg by mouth 2 (two) times daily.     furosemide (LASIX) 20 MG tablet Take 20 mg by mouth daily.     HYDROmorphone (DILAUDID) 4 MG tablet Take 4 mg by mouth every  6 (six) hours as needed for severe pain.     latanoprost (XALATAN) 0.005 % ophthalmic solution Place 1 drop into both eyes at bedtime.     metFORMIN (GLUCOPHAGE) 1000 MG tablet Take 1,000 mg by mouth daily with breakfast.     OLANZapine (ZYPREXA) 10 MG tablet Take 10 mg by mouth at bedtime.     pregabalin (LYRICA) 150 MG capsule Take 150 mg by mouth every 12 (twelve) hours.     rosuvastatin (CRESTOR) 40 MG tablet Take 40 mg by mouth daily.     spironolactone (ALDACTONE) 25 MG tablet Take 25 mg by mouth daily.     No current facility-administered medications for this visit.     ASSESSMENT & PLAN:   Assessment:   1. 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     2. Stage I renal cell carcinoma January 2012 treated with a partial left nephrectomy.  He has not had evidence of recurrence.  3. 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 CEA was normal.  This appears to be more consistent with a squamous cell type.  It was confirmed by PET scan to be an isolated lesion and he completed stereotactic radiation at [facility] in [place] in December 2020.    4. 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  5. Severe COPD.  He did have an episode of respiratory failure in November of 2016 requiring a temporary tracheostomy.  6. Deep venous thrombosis in August of 2017 with  associated pulmonary emboli.  7. Depression and bipolar disease.  8. Severe insulin-dependent diabetes mellitus, well controlled at this time.    9. History of substance abuse.  10. Surgery for incarcerated umbilical hernia in 2012.  11. Hospitalization in June 2024 for bilateral pneumonia and respiratory failure.  Plan: He has a remote history of a stage IIIa Pancoast non-small cell lung cancer in the apex of his lung with extension to bone, and a more recent stage I non-small cell cancer or the right upper lobe in November, 2020, and treated with SBRT. He states that he is now being seen at the pain clinic. He was hospitalized 2 weeks ago for 4 days for bilateral pneumonia and respiratory failure. His oxygen saturation was 72%, he was on 6 liters of oxygen for 2 days on admission, and he was in and out of consciousness. I advised his sister to take a course on CPR so as to know what to do in case of an emergency. His sister says his glucose was around 400mg  during the previous episode of pneumonia and I told her it was his body responding to the stressful situation. His subsequent blood sugars were under good control. His HbA1c on 07/28/2022 was 6. Patient states that he feels well and complains of chronic pain in his left side which is an 8/10. He has had a radical left nephrectomy for stage 1 renal cell carcinoma in 2021, and subsequent hernia in the area. He has no cough or chest pain. He  had a CT chest done on 09/07/22  revealed slight decrease in size of enlarged mediastinal lymph nodes, which is possibly due to his pneumonia, and his CEA in 06/02/2022 was normal at 1.2. I will see him back in 3 months with CBC, CMP, and  CEA. We will probably re-scan him in 6 months if he continues to do well. The patient understands the plans discussed today and is in agreement with them.  He knows to contact our office if he develops concerns prior to his next appointment.   I provided 25 minutes of  face-to-face time during this this encounter and > 50% was spent counseling as documented under my assessment and plan.    Dellia Beckwith, MD [facility] [address] [place] [address] Dept: [phone] Dept Fax: [phone]     I,Oluwatobi Asade,acting as a scribe for Dellia Beckwith, MD.,have documented all relevant documentation on the behalf of Dellia Beckwith, MD,as directed by  Dellia Beckwith, MD while in the presence of Dellia Beckwith, MD.

## 2022-09-08 NOTE — Telephone Encounter (Signed)
09/08/22 Left msg next appt scheduled on 12/09/22@930am  lab.DV at 10am

## 2022-09-16 ENCOUNTER — Encounter: Payer: Self-pay | Admitting: Hematology and Oncology

## 2022-09-29 ENCOUNTER — Telehealth: Payer: Self-pay

## 2022-09-29 NOTE — Telephone Encounter (Signed)
Pt requesting an appt to see Dr Gilman Buttner. He has a new lump that has developed lump on right side of throat. It is tender to palpate. No redness, warmth to touch, & afebrile. He denies sore throat. Please advise.

## 2022-12-09 ENCOUNTER — Other Ambulatory Visit: Payer: Medicare Other

## 2022-12-09 ENCOUNTER — Ambulatory Visit: Payer: Medicare Other | Admitting: Oncology

## 2022-12-23 ENCOUNTER — Ambulatory Visit: Payer: Medicare Other | Admitting: Oncology

## 2022-12-23 ENCOUNTER — Inpatient Hospital Stay: Payer: Medicare Other

## 2023-01-20 ENCOUNTER — Other Ambulatory Visit: Payer: Self-pay | Admitting: Oncology

## 2023-01-20 ENCOUNTER — Inpatient Hospital Stay: Payer: Medicare Other | Attending: Oncology

## 2023-01-20 ENCOUNTER — Encounter: Payer: Self-pay | Admitting: Oncology

## 2023-01-20 ENCOUNTER — Inpatient Hospital Stay (HOSPITAL_BASED_OUTPATIENT_CLINIC_OR_DEPARTMENT_OTHER): Payer: Medicare Other | Admitting: Oncology

## 2023-01-20 VITALS — BP 128/77 | HR 82 | Temp 98.0°F | Resp 18 | Ht 71.0 in | Wt 256.9 lb

## 2023-01-20 DIAGNOSIS — C3411 Malignant neoplasm of upper lobe, right bronchus or lung: Secondary | ICD-10-CM | POA: Insufficient documentation

## 2023-01-20 DIAGNOSIS — Z85528 Personal history of other malignant neoplasm of kidney: Secondary | ICD-10-CM | POA: Insufficient documentation

## 2023-01-20 DIAGNOSIS — F17290 Nicotine dependence, other tobacco product, uncomplicated: Secondary | ICD-10-CM | POA: Diagnosis not present

## 2023-01-20 LAB — CMP (CANCER CENTER ONLY)
ALT: 25 U/L (ref 0–44)
AST: 24 U/L (ref 15–41)
Albumin: 3.8 g/dL (ref 3.5–5.0)
Alkaline Phosphatase: 47 U/L (ref 38–126)
Anion gap: 9 (ref 5–15)
BUN: 20 mg/dL (ref 8–23)
CO2: 26 mmol/L (ref 22–32)
Calcium: 9.1 mg/dL (ref 8.9–10.3)
Chloride: 103 mmol/L (ref 98–111)
Creatinine: 1.05 mg/dL (ref 0.61–1.24)
GFR, Estimated: >60 mL/min (ref >60)
Glucose, Bld: 101 mg/dL — ABNORMAL HIGH (ref 70–99)
Potassium: 3.9 mmol/L (ref 3.5–5.1)
Sodium: 138 mmol/L (ref 135–145)
Total Bilirubin: 0.4 mg/dL (ref 0.3–1.2)
Total Protein: 7 g/dL (ref 6.5–8.1)

## 2023-01-20 LAB — CBC WITH DIFFERENTIAL (CANCER CENTER ONLY)
Abs Immature Granulocytes: 0.02 10*3/uL (ref 0.00–0.07)
Basophils Absolute: 0 10*3/uL (ref 0.0–0.1)
Basophils Relative: 0 %
Eosinophils Absolute: 0.1 10*3/uL (ref 0.0–0.5)
Eosinophils Relative: 2 %
HCT: 44.1 % (ref 39.0–52.0)
Hemoglobin: 13.7 g/dL (ref 13.0–17.0)
Immature Granulocytes: 0 %
Lymphocytes Relative: 24 %
Lymphs Abs: 1.9 10*3/uL (ref 0.7–4.0)
MCH: 29 pg (ref 26.0–34.0)
MCHC: 31.1 g/dL (ref 30.0–36.0)
MCV: 93.4 fL (ref 80.0–100.0)
Monocytes Absolute: 0.5 10*3/uL (ref 0.1–1.0)
Monocytes Relative: 6 %
Neutro Abs: 5.4 10*3/uL (ref 1.7–7.7)
Neutrophils Relative %: 68 %
Platelet Count: 289 10*3/uL (ref 150–400)
RBC: 4.72 MIL/uL (ref 4.22–5.81)
RDW: 14.3 % (ref 11.5–15.5)
WBC Count: 8.1 10*3/uL (ref 4.0–10.5)
nRBC: 0 % (ref 0.0–0.2)

## 2023-01-20 NOTE — Progress Notes (Signed)
[facility]  [address] [place],  [place]  [address] [phone]  Clinic Day:01/20/23  Referring physician: Olive Bass, MD  CHIEF COMPLAINT:  CC: History of multiple malignancies, most recently a stage I non small cell lung cancer in the right upper lobe  Current Treatment:  Surveillance  HISTORY OF PRESENT ILLNESS:  Jeremy Certo. is a 62 y.o. male with a history of stage IIIA non-small cell lung cancer diagnosed in August of 2010 when he presented with a large Pancoast tumor measuring over 8 cm in diameter with destruction of T1 and T2 vertebral bodies.  He received concurrent chemotherapy and radiation with weekly carboplatin and paclitaxel and completed 8 cycles of the chemotherapy.  He has never had any evidence of recurrence, but continues to have severe pain of the right shoulder and neck, which is felt to be from long-term neurologic damage.  He was incidentally found to have a stage I renal cell carcinoma which was treated with a partial left nephrectomy in January 2012 at [facility], and he has not had any recurrence of that either.  However, he has chronic pain of pain in the left flank where his abdomen bulges from his surgery.  He also had an umbilical hernia which became incarcerated and required surgery in 2012. He had opioid dependence, but was tapered off his narcotics last year.  He is not a candidate for methadone because of his other medications.  He also has a diagnosis of depression and bipolar disease.  CT chest in March 2016 revealed no evidence of malignancy, but he had extensive damage to the upper thoracic spine from his prior malignancy.  He was extremely short of breath when he saw Dr. Gilman Buttner in November 2016 and ended up hospitalized for acute respiratory failure for 7 weeks through January 2017.  He ended up with a temporary tracheostomy.  He has insulin dependent diabetic, likely from frequent steroids.  In  August 2017, he had severe swelling of the left lower extremity and was found to have extensive acute deep venous thrombosis throughout most of the leg.  He was later seen in the emergency room due to dyspnea and CT a chest revealed pulmonary emboli with evidence of right heart strain.  He was hospitalized on intravenous heparin, he was switched to Lovenox upon discharge and later switched to Xarelto.  Repeat CTA chest in February 2018 revealed resolution of the pulmonary embolism.  As he had been on anticoagulation for 6 months, Xarelto was discontinued at that time.  He presented to the emergency room with dyspnea and chest pain again in November 2018. CTA chest did not reveal any evidence of pulmonary embolism.  There was a new 7 mm right upper lobe nodule.  Repeat CT imaging in March 2019 revealed new mild mediastinal and bilateral hilar adenopathy measuring up to 1.4 cm in diameter.  An AP window lymph node had increased from 8 mm to 13 mm.  There was enlargement of spiculated nodule in the anterior right upper lobe measuring 1.5 cm, compared to 0.9 cm previously.  There were stable post radiation changes in the right upper paramediastinal area.  There were radiation changes of the right posterior 2nd rib and T2 vertebral body.  The CEA was up to 6.2.  He underwent PET scan for further evaluation.  This did not reveal hypermetabolic activity in the right upper lobe measuring 14 mm.  There was a focus of consolidation in the  right upper lobe suggestive of radiation changes, also without hypermetabolic activity.  No hypermetabolic activity was seen in the mediastinal lymph nodes.  When he was seen in May of 2019, the CEA had gone back down to 3.2.  CT chest in July revealed a slight decrease in the right upper lobe pulmonary nodule, from 1.5 cm to 1.3 cm. Repeat CT chest in October revealed further decrease of the right upper lobe nodule, now measuring 1.1 cm, as well as a 9 mm subcarinal node, which is not  pathologically enlarged.  He quit smoking about 2 years ago.  Unfortunately, he does vape.  His CT scan from June 16th 2020 revealed that the anterior right lobe pulmonary nodule had progressed in the interva.  CT scan from October 16th 2020 revealed an interval increase in size of a spiculated nodule of the anterior right upper lobe measuring 1.8 x 0.9 cm, previously 0.9 x 0.7 cm, highly concerning for recurrent or metachronous primary lung malignancy.  He then underwent a PET scan which showed the 1.8 cm spiculated right upper lobe nodule, suspicious for recurrent or metachronous primary bronchogenic neoplasm, less likely metastasis.  The SUV was measured to be 6.1.  His CEA continues to be within the normal range, the last being 1.9.  He underwent a CT guided biopsy on the isolated right lung nodule on November 10th.  This is consistent with a non-small cell type, favor squamous cell.  This is likely a second primary lung cancer, but appears to be a stage I.  The original pathology from 10 years ago was read as large cell lung carcinoma.  Since this is an isolated nodule, I would not recommend chemotherapy and he is not a good candidate for surgical resection.  He was treated with stereotactic radiation at [facility].  PFT's are consistent with asthma among other diagnoses.  He completed stereotactic radiation at [facility] in [place] in December 2020.  CT imaging from March 2021 revealed slight interval decrease in the right upper lobe nodule now measuring 1.8 x 1.0 cm, previously 2.1 x 1.1 cm.  CT chest from August 2021 revealed increased soft tissue density at the site of anterior right upper lobe treated pulmonary nodule, favored to be secondary to evolving radiation change.  CT imaging from February 2022 were stable.  INTERVAL HISTORY:  Jeremy Logan is here for routine follow up for multiple malignancies, most recently a stage I non small cell lung cancer in the right upper lobe, and a remote history of a  non-small cell lung cancer in the apex of his right lung with extension to bone. He also has a history of renal cell carcinoma stage I. Patient states that he feels well but complains of left sided pain rating an 8/10, he also tells me that his diabetes is under good control with an A1c of 5.6. His last CT scan was in June, 2024 and revealed slight decrease in size of enlarged mediastinal lymph nodes. He will be due for repeat CT chest in January, 2025. His labs today are pending and I will see him back in 3 months with CBC, CMP, CEA and CT chest. He denies infection such as sore throat, sinus drainage or urinary symptoms.  He denies fevers or recurrent chills. He denies pain. He denies nausea, vomiting, chest pain, dyspnea or cough. His appetite is great and his weight has increased 8 pounds over last 4 months . He is accompanied by his sister at today's appointment.   REVIEW  OF SYSTEMS:  Review of Systems  Constitutional: Negative.  Negative for appetite change, chills, diaphoresis, fatigue, fever and unexpected weight change.  HENT:  Negative.  Negative for hearing loss, lump/mass, mouth sores, nosebleeds, sore throat, tinnitus, trouble swallowing and voice change.   Eyes: Negative.  Negative for eye problems and icterus.  Respiratory: Negative.  Negative for chest tightness, cough, hemoptysis, shortness of breath and wheezing.   Cardiovascular: Negative.  Negative for chest pain, leg swelling and palpitations.  Gastrointestinal: Negative.  Negative for abdominal distention, abdominal pain, blood in stool, constipation, diarrhea, nausea, rectal pain and vomiting.  Endocrine: Negative.  Negative for hot flashes.  Genitourinary:  Negative for bladder incontinence, difficulty urinating, dyspareunia, dysuria, frequency, hematuria, nocturia, pelvic pain and penile discharge.   Musculoskeletal:  Positive for flank pain (left, chronic). Negative for arthralgias, back pain, gait problem, myalgias, neck pain  and neck stiffness.       Right shoulder pain, chronic  Skin: Negative.  Negative for itching, rash and wound.  Neurological: Negative.  Negative for dizziness, extremity weakness, gait problem, headaches, light-headedness, numbness, seizures and speech difficulty.  Hematological: Negative.  Negative for adenopathy. Does not bruise/bleed easily.  Psychiatric/Behavioral: Negative.  Negative for confusion, decreased concentration, depression, sleep disturbance and suicidal ideas. The patient is not nervous/anxious.   All other systems reviewed and are negative.    VITALS:  Blood pressure 128/77, pulse 82, temperature 98 F (36.7 C), temperature source Oral, resp. rate 18, height 5\' 11"  (1.803 m), weight 256 lb 14.4 oz (116.5 kg), SpO2 96%.  Wt Readings from Last 3 Encounters:  01/20/23 256 lb 14.4 oz (116.5 kg)  09/08/22 248 lb 6.4 oz (112.7 kg)  07/28/22 264 lb 15.9 oz (120.2 kg)    Body mass index is 35.83 kg/m.  Performance status (ECOG): 1 - Symptomatic but completely ambulatory  PHYSICAL EXAM:  Physical Exam Vitals and nursing note reviewed. Exam conducted with a chaperone present.  Constitutional:      General: He is not in acute distress.    Appearance: Normal appearance. He is normal weight. He is not ill-appearing, toxic-appearing or diaphoretic.  HENT:     Head: Normocephalic and atraumatic.     Right Ear: Tympanic membrane, ear canal and external ear normal. There is no impacted cerumen.     Left Ear: Tympanic membrane, ear canal and external ear normal. There is no impacted cerumen.     Nose: Nose normal. No congestion or rhinorrhea.     Mouth/Throat:     Mouth: Mucous membranes are moist.     Pharynx: Oropharynx is clear. No oropharyngeal exudate or posterior oropharyngeal erythema.  Eyes:     General: No scleral icterus.       Right eye: No discharge.        Left eye: No discharge.     Extraocular Movements: Extraocular movements intact.     Conjunctiva/sclera:  Conjunctivae normal.     Pupils: Pupils are equal, round, and reactive to light.  Neck:     Vascular: No carotid bruit.     Comments: Tracheostomy scar  Submandibular glands are easily palpable.  Cardiovascular:     Rate and Rhythm: Normal rate and regular rhythm.     Pulses: Normal pulses.     Heart sounds: Normal heart sounds. No murmur heard.    No friction rub. No gallop.  Pulmonary:     Effort: Pulmonary effort is normal. No respiratory distress.     Breath sounds: No stridor. Wheezing  present. No rhonchi or rales.     Comments: Slight expiratory wheeze Chest:     Chest wall: No tenderness.  Abdominal:     General: Bowel sounds are normal. There is no distension.     Palpations: Abdomen is soft. There is no hepatomegaly, splenomegaly or mass.     Tenderness: There is no abdominal tenderness. There is no right CVA tenderness, left CVA tenderness, guarding or rebound.     Hernia: A hernia is present.     Comments: Scaring of the umbilicus and a hernia at the left flank  Musculoskeletal:        General: No swelling, tenderness, deformity or signs of injury. Normal range of motion.     Cervical back: Normal range of motion and neck supple. No rigidity or tenderness.     Right lower leg: No edema.     Left lower leg: No edema.  Lymphadenopathy:     Cervical: No cervical adenopathy.  Skin:    General: Skin is warm and dry.     Coloration: Skin is not jaundiced or pale.     Findings: No bruising, erythema, lesion or rash.  Neurological:     General: No focal deficit present.     Mental Status: He is alert and oriented to person, place, and time. Mental status is at baseline.     Cranial Nerves: No cranial nerve deficit.     Sensory: No sensory deficit.     Motor: No weakness.     Coordination: Coordination normal.     Gait: Gait normal.     Deep Tendon Reflexes: Reflexes normal.  Psychiatric:        Mood and Affect: Mood normal.        Behavior: Behavior normal.         Thought Content: Thought content normal.        Judgment: Judgment normal.     LABS:      Latest Ref Rng & Units 01/20/2023   11:24 AM 07/31/2022    7:23 AM 07/30/2022    7:47 AM  CBC  WBC 4.0 - 10.5 K/uL 8.1  13.7  14.7   Hemoglobin 13.0 - 17.0 g/dL 69.6  29.5  28.4   Hematocrit 39.0 - 52.0 % 44.1  40.0  36.1   Platelets 150 - 400 K/uL 289  297  256       Latest Ref Rng & Units 01/20/2023   11:24 AM 07/29/2022    3:36 AM 07/28/2022    8:25 PM  CMP  Glucose 70 - 99 mg/dL 132  440    BUN 8 - 23 mg/dL 20  13    Creatinine 1.02 - 1.24 mg/dL 7.25  3.66  4.40   Sodium 135 - 145 mmol/L 138  136    Potassium 3.5 - 5.1 mmol/L 3.9  4.0    Chloride 98 - 111 mmol/L 103  104    CO2 22 - 32 mmol/L 26  24    Calcium 8.9 - 10.3 mg/dL 9.1  8.9    Total Protein 6.5 - 8.1 g/dL 7.0     Total Bilirubin 0.3 - 1.2 mg/dL 0.4     Alkaline Phos 38 - 126 U/L 47     AST 15 - 41 U/L 24     ALT 0 - 44 U/L 25        Lab Results  Component Value Date   CEA1 1.7 01/20/2023   CEA 1.2 06/02/2022   /  CEA  Date Value Ref Range Status  01/20/2023 1.7 0.0 - 4.7 ng/mL Final    Comment:    (NOTE)                             Nonsmokers          <3.9                             Smokers             <5.6 [employer] Electrochemiluminescence Immunoassay (ECLIA) Values obtained with different assay methods or kits cannot be used interchangeably.  Results cannot be interpreted as absolute evidence of the presence or absence of malignant disease. Performed At: [facility] [address] [place], [place] [id number] Jolene Schimke MD WU:[id number]   06/02/2022 1.2  Final     STUDIES:   Exam: 09/07/2022 CT CHEST WITHOUT CONTRAST IMPRESSION: There is significant interval decrease in alveolar infiltrates in the right lower lobe. There is subtle residual prominence of interstitial markings in the right lower lobe suggesting residual interstitial pneumonia. There is interval resolution of  infiltrate in the left lower lobe. Scaring in the right lung field has not changed.  There is slight decrease in size of enlarged mediastinal lymph nodes, possibly suggesting improving inflammatory process.  There are no new infiltrates. There is no pleural effusion or pneumothorax.    EXAM: 07/28/2022 CT ANGIOGRAPHY HEAD AND NECK  IMPRESSION: 1. No acute intracranial pathology. 2. Patent vasculature of the head and neck with no hemodynamically significant stenosis, occlusion, or dissection.  EXAM: 07/28/2022 CT ANGIOGRAPHY CHEST WITH CONTRAST IMPRESSION: No evidence of thoracic aortic dissection or aneurysm.  However, there is interval element of large opacity seen involving right lower lobe most consistent with pneumonia. Multiple smaller patchy airspace opacities are noted in the left lower lobe and right upper lobe most likely inflammatory in etiology, suggesting multifocal pneumonia.  Enlarged right paratracheal, subcarinal and right hilar lymph nodes are noted which most likely are inflammatory in etiology given the presence of pneumonia, but follow-up CT scan in 3-4 weeks is recommended to ensure resolution or stability.      EXAM:06/02/22 CT CHEST WITH CONTRAST IMPRESSION: Interval resolution of the interstitial opacity and patchy airspace consolidation seen previously in the right middle lobe and anterior right lower lobe.  Cluster of nodularity in the posterior right lower lobe noted today including dominant 9 mm right lower lobe nodule. Imaging features are most likely infectious/inflammatory but close up follow up warranted. Upper normal mediastinal lymph nodes are larger than on the prior study but not pathologic by CT size criteria. 3.9 x 2.3 cm subtle focus of increased attenuation/enhancement in the subcapsular left liver along the gallbladder fossa. This is nonspecific and may be related to focal fatty sparing. MRI recommended Stable appearance of mixed density  lesion in the manubrium and right posterior second rib/vertebral body. 3,3 x 2.2 cm soft tissue structure in the lower right para midline neck has been incompletely visualized. This is presumably the patients right submandibular gland although projects lower than typically seen.    Allergies:   Allergies  Allergen Reactions   Atorvastatin Other (See Comments)    Other reaction(s): Myalgias (intolerance)  08/07/2021: prob not related    Current Medications: Current Outpatient Medications  Medication Sig Dispense Refill   morphine (MSIR) 15 MG tablet Take 15 mg by mouth every  8 (eight) hours as needed.     amitriptyline (ELAVIL) 50 MG tablet Take 50 mg by mouth at bedtime.     aspirin EC 81 MG tablet Take 81 mg by mouth daily.     buPROPion (WELLBUTRIN XL) 150 MG 24 hr tablet Take 150 mg by mouth every morning.     carvedilol (COREG) 3.125 MG tablet Take 3.125 mg by mouth 2 (two) times daily with a meal.      Dulaglutide 4.5 MG/0.5ML SOPN Inject into the skin once a week.     FARXIGA 10 MG TABS tablet Take 10 mg by mouth daily.     FLUoxetine (PROZAC) 20 MG capsule Take 40 mg by mouth 2 (two) times daily.     furosemide (LASIX) 20 MG tablet Take 20 mg by mouth daily.     OLANZapine (ZYPREXA) 10 MG tablet Take 10 mg by mouth at bedtime.     pregabalin (LYRICA) 150 MG capsule Take 150 mg by mouth every 12 (twelve) hours.     rosuvastatin (CRESTOR) 40 MG tablet Take 40 mg by mouth daily.     spironolactone (ALDACTONE) 25 MG tablet Take 25 mg by mouth daily.     No current facility-administered medications for this visit.     ASSESSMENT & PLAN:  Assessment:   1. 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     2. Stage I renal cell carcinoma January 2012 treated with a partial left nephrectomy.  He has not had evidence of recurrence.  3. Stage I non small cell lung cancer in the right  upper lobe, diagnosed in November 2020. CEA was normal.  This appears to be more consistent with a squamous cell type.  It was confirmed by PET scan to be an isolated lesion and he completed stereotactic radiation at [facility] in [place] in December 2020.    4. 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  5. Severe COPD.  He did have an episode of respiratory failure in November of 2016 requiring a temporary tracheostomy.  6. Deep venous thrombosis in August of 2017 with associated pulmonary emboli.  7. Depression and bipolar disease.  8. Severe insulin-dependent diabetes mellitus, well controlled at this time.    9. History of substance abuse.  10. Surgery for incarcerated umbilical hernia in 2012.  11. Hospitalization in June 2024 for bilateral pneumonia and respiratory failure.  Plan:  His last CT scan was in June, 2024 and revealed slight decrease in size of enlarged mediastinal lymph nodes. He will be due for repeat CT chest in January, 2025. His labs today are pending and I will see him back in 3 months with CBC, CMP, CEA and CT chest. The patient understands the plans discussed today and is in agreement with them.  He knows to contact our office if he develops concerns prior to his next appointment.  I provided 10 minutes of face-to-face time during this this encounter and > 50% was spent counseling as documented under my assessment and plan.    Dellia Beckwith, MD [facility] AT [facility] [address] [place] [address] Dept: [phone] Dept Fax: [phone]    Rulon Sera Lassiter,acting as a scribe for Dellia Beckwith, MD.,have documented all relevant documentation on the behalf of Jeremy Logan  Gilman Buttner, MD,as directed by  Dellia Beckwith, MD while in the presence of Dellia Beckwith, MD.

## 2023-01-21 LAB — CEA: CEA: 1.7 ng/mL (ref 0.0–4.7)

## 2023-01-25 ENCOUNTER — Telehealth: Payer: Self-pay | Admitting: Oncology

## 2023-01-25 NOTE — Telephone Encounter (Signed)
Patient has been scheduled. Aware of appt date and time.    Message Header Department: [facility] [[id number]]   Scheduling Message Entered by Dellia Beckwith on 01/20/2023 at 12:22 PM Priority: Routine <No visit type provided>  Department: [facility]  Provider:  Scheduling Notes:  RT 3 months with labs and CT chest before - request am appts  Pls call sister Amy with appts

## 2023-02-11 ENCOUNTER — Encounter: Payer: Self-pay | Admitting: Hematology and Oncology

## 2023-04-12 LAB — LAB REPORT - SCANNED: EGFR: 68

## 2023-04-14 NOTE — Progress Notes (Signed)
[facility]  [address] [place],  [place]  [address] [phone]  Clinic Day: 04/20/23  Referring physician: Olive Bass, MD  CHIEF COMPLAINT:  CC: History of multiple malignancies, most recently a stage I non small cell lung cancer in the right upper lobe  Current Treatment:  Surveillance  HISTORY OF PRESENT ILLNESS:  Jeremy Fohl. is a 63 y.o. male with a history of stage IIIA non-small cell lung cancer diagnosed in August of 2010 when he presented with a large Pancoast tumor measuring over 8 cm in diameter with destruction of T1 and T2 vertebral bodies.  He received concurrent chemotherapy and radiation with weekly carboplatin and paclitaxel and completed 8 cycles of the chemotherapy.  He has never had any evidence of recurrence, but continues to have severe pain of the right shoulder and neck, which is felt to be from long-term neurologic damage.  He was incidentally found to have a stage I renal cell carcinoma which was treated with a partial left nephrectomy in January 2012 at [facility], and he has not had any recurrence of that either.  However, he has chronic pain of pain in the left flank where his abdomen bulges from his surgery.  He also had an umbilical hernia which became incarcerated and required surgery in 2012. He had opioid dependence, but was tapered off his narcotics last year.  He is not a candidate for methadone because of his other medications.  He also has a diagnosis of depression and bipolar disease.  CT chest in March 2016 revealed no evidence of malignancy, but he had extensive damage to the upper thoracic spine from his prior malignancy.  He was extremely short of breath when he saw Dr. Gilman Buttner in November 2016 and ended up hospitalized for acute respiratory failure for 7 weeks through January 2017.  He ended up with a temporary tracheostomy.  He has insulin dependent diabetic, likely from frequent steroids.  In  August 2017, he had severe swelling of the left lower extremity and was found to have extensive acute deep venous thrombosis throughout most of the leg.  He was later seen in the emergency room due to dyspnea and CT a chest revealed pulmonary emboli with evidence of right heart strain.  He was hospitalized on intravenous heparin, he was switched to Lovenox upon discharge and later switched to Xarelto.  Repeat CTA chest in February 2018 revealed resolution of the pulmonary embolism.  As he had been on anticoagulation for 6 months, Xarelto was discontinued at that time.  He presented to the emergency room with dyspnea and chest pain again in November 2018. CTA chest did not reveal any evidence of pulmonary embolism.  There was a new 7 mm right upper lobe nodule.  Repeat CT imaging in March 2019 revealed new mild mediastinal and bilateral hilar adenopathy measuring up to 1.4 cm in diameter.  An AP window lymph node had increased from 8 mm to 13 mm.  There was enlargement of spiculated nodule in the anterior right upper lobe measuring 1.5 cm, compared to 0.9 cm previously.  There were stable post radiation changes in the right upper paramediastinal area.  There were radiation changes of the right posterior 2nd rib and T2 vertebral body.  The CEA was up to 6.2. PET scan did not reveal hypermetabolic activity in the right upper lobe measuring 14 mm.  There was a focus of consolidation in the right upper lobe suggestive of radiation changes,  also without hypermetabolic activity.  No hypermetabolic activity was seen in the mediastinal lymph nodes.  When he was seen in May of 2019, the CEA had gone back down to 3.2.  CT chest in July revealed a slight decrease in the right upper lobe pulmonary nodule, from 1.5 cm to 1.3 cm. Repeat CT chest in October revealed further decrease of the right upper lobe nodule, now measuring 1.1 cm, as well as a 9 mm subcarinal node, which is not pathologically enlarged.  He quit smoking  several years ago.  Unfortunately, he does vape.  His CT scan from June 16th 2020 revealed that the anterior right lobe pulmonary nodule had progressed in the interval.  CT scan from October 16th 2020 revealed an interval increase in size of a spiculated nodule of the anterior right upper lobe measuring 1.8 x 0.9 cm, previously 0.9 x 0.7 cm, highly concerning for recurrent or metachronous primary lung malignancy.  He then underwent a PET scan which showed the 1.8 cm spiculated right upper lobe nodule, suspicious for recurrent or metachronous primary bronchogenic neoplasm, less likely metastasis.  The SUV was measured to be 6.1.  His CEA continues to be within the normal range, the last being 1.9.  He underwent a CT guided biopsy on the isolated right lung nodule on November 10th, 2020.  This is consistent with a non-small cell type, favor squamous cell.  This is likely a second primary lung cancer, but appears to be a stage I.  The original pathology from 10 years ago was read as large cell lung carcinoma.  Since this is an isolated nodule, I would not recommend chemotherapy and he is not a good candidate for surgical resection.  He was treated with stereotactic radiation at [facility].  PFT's are consistent with asthma among other diagnoses.  He completed stereotactic radiation at [facility] in [place] in December 2020.  CT imaging from March 2021 revealed slight interval decrease in the right upper lobe nodule now measuring 1.8 x 1.0 cm, previously 2.1 x 1.1 cm.  CT chest from August 2021 revealed increased soft tissue density at the site of anterior right upper lobe treated pulmonary nodule, favored to be secondary to evolving radiation change.  CT imaging from February 2022 was stable.  INTERVAL HISTORY:  Jeremy Logan is here for routine follow up for multiple malignancies, most recently a stage I non small cell lung cancer in the right upper lobe in 2020, and a remote history (2010) of a non-small cell lung  cancer in the apex of his right lung with extension to bone. He also has a history of renal cell carcinoma stage I. Patient states that he feels well but complains of his right hand going numb about 3 times this month. He has an appointment with neurology in April. His sister informed me that he was taken off of Metformin and is only taking Farxiga 10 mg and Trulicity 4.5 mg weekly now. I instructed him to discuss with his PCP about getting an inhaler for times that he is short of breath or wheezing. I do hear some wheezing of the right lung today. He had a CT chest done on 04/12/2023 that revealed no acute cardiothoracic abnormalities, interval resolution of previously seen right lower lobe infiltrate, stable scarring in the right upper lobe, and a stable small left thyroid nodule. He has a WBC of 6.9, hemoglobin of 15.2, and platelet count of 287,000. His CMP is normal other than a slightly elevated BUN of  21. His CEA was normal. He will have his port flushed today. I will see him back in 6 months with CBC, CMP, CEA, and port flush; if all is well he will have repeat CT scan in a year from his last. He denies signs of infection such as sore throat, sinus drainage, cough, or urinary symptoms.  He denies fevers or recurrent chills. He denies pain. He denies nausea, vomiting, chest pain, or cough. His appetite is good and his weight has decreased 3 pounds over last 3 months . He is accompanied by his sister at today's appointment.   REVIEW OF SYSTEMS:  Review of Systems  Constitutional: Negative.  Negative for appetite change, chills, diaphoresis, fatigue, fever and unexpected weight change.  HENT:  Negative.  Negative for hearing loss, lump/mass, mouth sores, nosebleeds, sore throat, tinnitus, trouble swallowing and voice change.   Eyes: Negative.  Negative for eye problems and icterus.  Respiratory: Negative.  Negative for chest tightness, cough, hemoptysis, shortness of breath and wheezing.    Cardiovascular: Negative.  Negative for chest pain, leg swelling and palpitations.  Gastrointestinal: Negative.  Negative for abdominal distention, abdominal pain, blood in stool, constipation, diarrhea, nausea, rectal pain and vomiting.  Endocrine: Negative.  Negative for hot flashes.  Genitourinary:  Negative for bladder incontinence, difficulty urinating, dyspareunia, dysuria, frequency, hematuria, nocturia, pelvic pain and penile discharge.   Musculoskeletal:  Positive for flank pain (left, chronic). Negative for arthralgias, back pain, gait problem, myalgias, neck pain and neck stiffness.       Right shoulder pain, chronic  Skin: Negative.  Negative for itching, rash and wound.  Neurological: Negative.  Negative for dizziness, extremity weakness, gait problem, headaches, light-headedness, numbness, seizures and speech difficulty.  Hematological: Negative.  Negative for adenopathy. Does not bruise/bleed easily.  Psychiatric/Behavioral: Negative.  Negative for confusion, decreased concentration, depression, sleep disturbance and suicidal ideas. The patient is not nervous/anxious.   All other systems reviewed and are negative.    VITALS:  Blood pressure 121/63, pulse 75, temperature 97.6 F (36.4 C), temperature source Oral, resp. rate 18, height 5\' 11"  (1.803 m), weight 253 lb 6.4 oz (114.9 kg), SpO2 95%.  Wt Readings from Last 3 Encounters:  04/20/23 253 lb 6.4 oz (114.9 kg)  01/20/23 256 lb 14.4 oz (116.5 kg)  09/08/22 248 lb 6.4 oz (112.7 kg)    Body mass index is 35.34 kg/m.  Performance status (ECOG): 1 - Symptomatic but completely ambulatory  PHYSICAL EXAM:  Physical Exam Vitals and nursing note reviewed. Exam conducted with a chaperone present.  Constitutional:      General: He is not in acute distress.    Appearance: Normal appearance. He is normal weight. He is not ill-appearing, toxic-appearing or diaphoretic.  HENT:     Head: Normocephalic and atraumatic.     Right  Ear: Tympanic membrane, ear canal and external ear normal. There is no impacted cerumen.     Left Ear: Tympanic membrane, ear canal and external ear normal. There is no impacted cerumen.     Nose: Nose normal. No congestion or rhinorrhea.     Mouth/Throat:     Mouth: Mucous membranes are moist.     Pharynx: Oropharynx is clear. No oropharyngeal exudate or posterior oropharyngeal erythema.  Eyes:     General: No scleral icterus.       Right eye: No discharge.        Left eye: No discharge.     Extraocular Movements: Extraocular movements intact.  Conjunctiva/sclera: Conjunctivae normal.     Pupils: Pupils are equal, round, and reactive to light.  Neck:     Vascular: No carotid bruit.     Comments: Tracheostomy scar  Cardiovascular:     Rate and Rhythm: Normal rate and regular rhythm.     Pulses: Normal pulses.     Heart sounds: Normal heart sounds. No murmur heard.    No friction rub. No gallop.  Pulmonary:     Effort: Pulmonary effort is normal. No respiratory distress.     Breath sounds: No stridor. Examination of the right-upper field reveals rhonchi. Examination of the right-middle field reveals rhonchi. Examination of the right-lower field reveals rhonchi. Rhonchi present. No wheezing or rales.     Comments: Scattered expiratory rhonchi in the right lung Chest:     Chest wall: No tenderness.  Abdominal:     General: Bowel sounds are normal. There is no distension.     Palpations: Abdomen is soft. There is no hepatomegaly, splenomegaly or mass.     Tenderness: There is no abdominal tenderness. There is no right CVA tenderness, left CVA tenderness, guarding or rebound.     Hernia: A hernia is present.     Comments: Scaring of the umbilicus and a hernia at the left flank  Musculoskeletal:        General: No swelling, tenderness, deformity or signs of injury. Normal range of motion.     Cervical back: Normal range of motion and neck supple. No rigidity or tenderness.     Right  lower leg: No edema.     Left lower leg: No edema.  Lymphadenopathy:     Cervical: No cervical adenopathy.  Skin:    General: Skin is warm and dry.     Coloration: Skin is not jaundiced or pale.     Findings: No bruising, erythema, lesion or rash.  Neurological:     General: No focal deficit present.     Mental Status: He is alert and oriented to person, place, and time. Mental status is at baseline.     Cranial Nerves: No cranial nerve deficit.     Sensory: No sensory deficit.     Motor: No weakness.     Coordination: Coordination normal.     Gait: Gait normal.     Deep Tendon Reflexes: Reflexes normal.  Psychiatric:        Mood and Affect: Mood normal.        Behavior: Behavior normal.        Thought Content: Thought content normal.        Judgment: Judgment normal.     LABS:      Latest Ref Rng & Units 01/20/2023   11:24 AM 07/31/2022    7:23 AM 07/30/2022    7:47 AM  CBC  WBC 4.0 - 10.5 K/uL 8.1  13.7  14.7   Hemoglobin 13.0 - 17.0 g/dL 16.1  09.6  04.5   Hematocrit 39.0 - 52.0 % 44.1  40.0  36.1   Platelets 150 - 400 K/uL 289  297  256       Latest Ref Rng & Units 01/20/2023   11:24 AM 07/29/2022    3:36 AM 07/28/2022    8:25 PM  CMP  Glucose 70 - 99 mg/dL 409  811    BUN 8 - 23 mg/dL 20  13    Creatinine 9.14 - 1.24 mg/dL 7.82  9.56  2.13   Sodium 135 - 145 mmol/L 138  136    Potassium 3.5 - 5.1 mmol/L 3.9  4.0    Chloride 98 - 111 mmol/L 103  104    CO2 22 - 32 mmol/L 26  24    Calcium 8.9 - 10.3 mg/dL 9.1  8.9    Total Protein 6.5 - 8.1 g/dL 7.0     Total Bilirubin 0.3 - 1.2 mg/dL 0.4     Alkaline Phos 38 - 126 U/L 47     AST 15 - 41 U/L 24     ALT 0 - 44 U/L 25      Lab Results  Component Value Date   CEA1 1.7 01/20/2023   CEA 1.2 06/02/2022   /  CEA  Date Value Ref Range Status  01/20/2023 1.7 0.0 - 4.7 ng/mL Final    Comment:    (NOTE)                             Nonsmokers          <3.9                             Smokers              <5.6 [employer] Electrochemiluminescence Immunoassay (ECLIA) Values obtained with different assay methods or kits cannot be used interchangeably.  Results cannot be interpreted as absolute evidence of the presence or absence of malignant disease. Performed At: [facility] [address] [place], [place] [id number] Jolene Schimke MD JY:[id number]   06/02/2022 1.2  Final    STUDIES:      HISTORY:   Allergies  Allergen Reactions   Atorvastatin Other (See Comments)    Other reaction(s): Myalgias (intolerance)  08/07/2021: prob not related    Current Medications: Current Outpatient Medications  Medication Sig Dispense Refill   latanoprost (XALATAN) 0.005 % ophthalmic solution 1 drop at bedtime.     pregabalin (LYRICA) 100 MG capsule Take 100 mg by mouth every 12 (twelve) hours.     amitriptyline (ELAVIL) 50 MG tablet Take 50 mg by mouth at bedtime.     aspirin EC 81 MG tablet Take 81 mg by mouth daily.     buPROPion (WELLBUTRIN XL) 150 MG 24 hr tablet Take 150 mg by mouth every morning.     carvedilol (COREG) 3.125 MG tablet Take 3.125 mg by mouth 2 (two) times daily with a meal.      Dulaglutide 4.5 MG/0.5ML SOPN Inject into the skin once a week.     FARXIGA 10 MG TABS tablet Take 10 mg by mouth daily.     FLUoxetine (PROZAC) 20 MG capsule Take 40 mg by mouth 2 (two) times daily.     furosemide (LASIX) 20 MG tablet Take 20 mg by mouth daily.     morphine (MSIR) 15 MG tablet Take 15 mg by mouth every 8 (eight) hours as needed.     OLANZapine (ZYPREXA) 10 MG tablet Take 10 mg by mouth at bedtime.     rosuvastatin (CRESTOR) 40 MG tablet Take 40 mg by mouth daily.     spironolactone (ALDACTONE) 25 MG tablet Take 25 mg by mouth daily.     No current facility-administered medications for this visit.     ASSESSMENT & PLAN:  Assessment:   1. Stage IIIA non-small cell lung cancer diagnosed in August 2010 with a large Pancoast tumor, treated with concurrent  chemotherapy and radiation.     2. Stage I renal cell carcinoma January 2012 treated with a partial left nephrectomy.  He has not had evidence of recurrence.  3. 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 CEA was normal.  This appears to be more consistent with a squamous cell type.  It was confirmed by PET scan to be an isolated lesion and he completed stereotactic radiation at [facility] in [place] in December 2020.    4. 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  5. Severe COPD.  He did have an episode of respiratory failure in November of 2016 requiring a temporary tracheostomy.  6. Deep venous thrombosis in August of 2017 with associated pulmonary emboli.  7. Depression and bipolar disease.  8. Severe insulin-dependent diabetes mellitus, well controlled at this time.    9. History of substance abuse.  10. Surgery for incarcerated umbilical hernia in 2012.  Plan: He has an appointment with neurology in April for the numbness of his hand. His sister informed me that he was taken off of Metformin and is only taking Farxiga 10 mg and Trulicity 4.5 mg weekly now. I instructed him to discuss with his PCP about getting an inhaler for times when he is short of breath or wheezing. I do hear some wheezing of the right lung today. He had a CT chest done on 04/12/2023 that revealed no acute cardiothoracic abnormalities, interval resolution of previously seen right lower lobe infiltrate, stable scarring in the right upper lobe, and a stable small left thyroid nodule. He has a WBC of 6.9, hemoglobin of 15.2, and platelet count of 287,000. His CMP is normal  a other than an elevated BUN of 21. His CEA was normal. He will have his port flushed today. I will see him back in 6 months with CBC, CMP, CEA, and port flush; if all is well he will have repeat CT scan in a year.   The patient understands the plans discussed today and is in agreement with them.  He knows to contact our office if he develops concerns prior to his next appointment.  I provided 19 minutes of face-to-face time during this this encounter and > 50% was spent counseling as documented under my assessment and plan.   Dellia Beckwith, MD [facility] [address] [place] [address] Dept: [phone] Dept Fax: [phone]   No orders of the defined types were placed in this encounter.  I,Jasmine M Lassiter,acting as a scribe for Dellia Beckwith, MD.,have documented all relevant documentation on the behalf of Dellia Beckwith, MD,as directed by  Dellia Beckwith, MD while in the presence of Dellia Beckwith, MD.

## 2023-04-15 ENCOUNTER — Encounter: Payer: Self-pay | Admitting: Oncology

## 2023-04-20 ENCOUNTER — Other Ambulatory Visit: Payer: Self-pay | Admitting: Oncology

## 2023-04-20 ENCOUNTER — Inpatient Hospital Stay: Payer: Medicare Other | Attending: Oncology | Admitting: Oncology

## 2023-04-20 ENCOUNTER — Inpatient Hospital Stay: Payer: Medicare Other

## 2023-04-20 VITALS — BP 121/63 | HR 75 | Temp 97.6°F | Resp 18 | Ht 71.0 in | Wt 253.4 lb

## 2023-04-20 DIAGNOSIS — J449 Chronic obstructive pulmonary disease, unspecified: Secondary | ICD-10-CM | POA: Insufficient documentation

## 2023-04-20 DIAGNOSIS — M542 Cervicalgia: Secondary | ICD-10-CM | POA: Diagnosis not present

## 2023-04-20 DIAGNOSIS — E119 Type 2 diabetes mellitus without complications: Secondary | ICD-10-CM | POA: Insufficient documentation

## 2023-04-20 DIAGNOSIS — Z794 Long term (current) use of insulin: Secondary | ICD-10-CM | POA: Insufficient documentation

## 2023-04-20 DIAGNOSIS — F319 Bipolar disorder, unspecified: Secondary | ICD-10-CM | POA: Insufficient documentation

## 2023-04-20 DIAGNOSIS — R232 Flushing: Secondary | ICD-10-CM | POA: Insufficient documentation

## 2023-04-20 DIAGNOSIS — R944 Abnormal results of kidney function studies: Secondary | ICD-10-CM | POA: Insufficient documentation

## 2023-04-20 DIAGNOSIS — Z7982 Long term (current) use of aspirin: Secondary | ICD-10-CM | POA: Diagnosis not present

## 2023-04-20 DIAGNOSIS — Z7984 Long term (current) use of oral hypoglycemic drugs: Secondary | ICD-10-CM | POA: Diagnosis not present

## 2023-04-20 DIAGNOSIS — Z7985 Long-term (current) use of injectable non-insulin antidiabetic drugs: Secondary | ICD-10-CM | POA: Diagnosis not present

## 2023-04-20 DIAGNOSIS — C3411 Malignant neoplasm of upper lobe, right bronchus or lung: Secondary | ICD-10-CM | POA: Diagnosis not present

## 2023-04-20 DIAGNOSIS — Z923 Personal history of irradiation: Secondary | ICD-10-CM | POA: Diagnosis not present

## 2023-04-20 DIAGNOSIS — F1120 Opioid dependence, uncomplicated: Secondary | ICD-10-CM | POA: Insufficient documentation

## 2023-04-20 DIAGNOSIS — K420 Umbilical hernia with obstruction, without gangrene: Secondary | ICD-10-CM | POA: Insufficient documentation

## 2023-04-20 DIAGNOSIS — Z87891 Personal history of nicotine dependence: Secondary | ICD-10-CM | POA: Diagnosis not present

## 2023-04-20 DIAGNOSIS — Z86718 Personal history of other venous thrombosis and embolism: Secondary | ICD-10-CM | POA: Diagnosis not present

## 2023-04-20 DIAGNOSIS — Z86711 Personal history of pulmonary embolism: Secondary | ICD-10-CM | POA: Diagnosis not present

## 2023-04-20 DIAGNOSIS — M25511 Pain in right shoulder: Secondary | ICD-10-CM | POA: Insufficient documentation

## 2023-04-20 DIAGNOSIS — Z79899 Other long term (current) drug therapy: Secondary | ICD-10-CM | POA: Diagnosis not present

## 2023-04-20 MED ORDER — HEPARIN SOD (PORK) LOCK FLUSH 100 UNIT/ML IV SOLN
500.0000 [IU] | Freq: Once | INTRAVENOUS | Status: AC | PRN
  Administered 2023-04-20: 500 [IU]

## 2023-04-20 MED ORDER — SODIUM CHLORIDE 0.9% FLUSH
10.0000 mL | INTRAVENOUS | Status: DC | PRN
  Administered 2023-04-20: 10 mL

## 2023-04-20 NOTE — Patient Instructions (Signed)

## 2023-04-25 ENCOUNTER — Encounter: Payer: Self-pay | Admitting: Hematology and Oncology

## 2023-04-25 ENCOUNTER — Encounter: Payer: Self-pay | Admitting: Oncology

## 2023-10-19 ENCOUNTER — Ambulatory Visit: Payer: Medicare Other | Admitting: Oncology

## 2023-10-19 ENCOUNTER — Other Ambulatory Visit: Payer: Medicare Other

## 2023-10-20 NOTE — Progress Notes (Addendum)
 Jeremy Logan  [address] [place],  [place]  [address] [phone]  Clinic Day: 10/21/2023  Referring physician: Ofilia Lamar CROME, MD  CHIEF COMPLAINT:  CC: History of multiple malignancies, most recently a stage I non small cell lung cancer in the right upper lobe  Current Treatment:  Surveillance  HISTORY OF PRESENT ILLNESS:  Jeremy Logan. is a 63 y.o. male with a history of stage IIIA non-small cell lung cancer diagnosed in August of 2010 when he presented with a large Pancoast tumor measuring over 8 cm in diameter with destruction of T1 and T2 vertebral bodies.  He received concurrent chemotherapy and radiation with weekly carboplatin and paclitaxel and completed 8 cycles of the chemotherapy.  He has never had any evidence of recurrence, but continues to have severe pain of the right shoulder and neck, which is felt to be from long-term neurologic damage.  He was incidentally found to have a stage I renal cell carcinoma which was treated with a partial left nephrectomy in January 2012 at [facility], and he has not had any recurrence of that either.  However, he has chronic pain of pain in the left flank where his abdomen bulges from his surgery.  He also had an umbilical hernia which became incarcerated and required surgery in 2012. He had opioid dependence, but was tapered off his narcotics last year.  He is not a candidate for methadone because of his other medications.  He also has a diagnosis of depression and bipolar disease.  CT chest in March 2016 revealed no evidence of malignancy, but he had extensive damage to the upper thoracic spine from his prior malignancy.  He was extremely short of breath when he saw Dr. Cornelius in November 2016 and ended up hospitalized for acute respiratory failure for 7 weeks through January 2017.  He ended up with a temporary tracheostomy.  He has insulin  dependent diabetic, likely from frequent steroids.  In August 2017, he had  severe swelling of the left lower extremity and was found to have extensive acute deep venous thrombosis throughout most of the leg.  He was later seen in the emergency room due to dyspnea and CT a chest revealed pulmonary emboli with evidence of right heart strain.  He was hospitalized on intravenous heparin , he was switched to Lovenox  upon discharge and later switched to Xarelto.  Repeat CTA chest in February 2018 revealed resolution of the pulmonary embolism.  As he had been on anticoagulation for 6 months, Xarelto was discontinued at that time.  He presented to the emergency room with dyspnea and chest pain again in November 2018. CTA chest did not reveal any evidence of pulmonary embolism.  There was a new 7 mm right upper lobe nodule.  Repeat CT imaging in March 2019 revealed new mild mediastinal and bilateral hilar adenopathy measuring up to 1.4 cm in diameter.  An AP window lymph node had increased from 8 mm to 13 mm.  There was enlargement of spiculated nodule in the anterior right upper lobe measuring 1.5 cm, compared to 0.9 cm previously.  There were stable post radiation changes in the right upper paramediastinal area.  There were radiation changes of the right posterior 2nd rib and T2 vertebral body.  The CEA was up to 6.2. PET scan did not reveal hypermetabolic activity in the right upper lobe measuring 14 mm.  There was a focus of consolidation in the right upper lobe suggestive of radiation changes, also without  hypermetabolic activity.  No hypermetabolic activity was seen in the mediastinal lymph nodes.  When he was seen in May of 2019, the CEA had gone back down to 3.2.  CT chest in July revealed a slight decrease in the right upper lobe pulmonary nodule, from 1.5 cm to 1.3 cm. Repeat CT chest in October revealed further decrease of the right upper lobe nodule, now measuring 1.1 cm, as well as a 9 mm subcarinal node, which is not pathologically enlarged.  He quit smoking several years ago.   Unfortunately, he does vape.  His CT scan from June 16th 2020 revealed that the anterior right lobe pulmonary nodule had progressed in the interval.  CT scan from October 16th 2020 revealed an interval increase in size of a spiculated nodule of the anterior right upper lobe measuring 1.8 x 0.9 cm, previously 0.9 x 0.7 cm, highly concerning for recurrent or metachronous primary lung malignancy.  He then underwent a PET scan which showed the 1.8 cm spiculated right upper lobe nodule, suspicious for recurrent or metachronous primary bronchogenic neoplasm, less likely metastasis.  The SUV was measured to be 6.1.  His CEA continues to be within the normal range, the last being 1.9.  He underwent a CT guided biopsy on the isolated right lung nodule on November 10th, 2020.  This is consistent with a non-small cell type, favor squamous cell.  This is likely a second primary lung cancer, but appears to be a stage I.  The original pathology from 10 years ago was read as large cell lung carcinoma.  Since this is an isolated nodule, I would not recommend chemotherapy and he is not a good candidate for surgical resection.  He was treated with stereotactic radiation at [facility]  PFT's are consistent with asthma among other diagnoses.  He completed stereotactic radiation at [facility] in [place] in December 2020.  CT imaging from March 2021 revealed slight interval decrease in the right upper lobe nodule now measuring 1.8 x 1.0 cm, previously 2.1 x 1.1 cm.  CT chest from August 2021 revealed increased soft tissue density at the site of anterior right upper lobe treated pulmonary nodule, favored to be secondary to evolving radiation change.  CT imaging from February 2022 was stable.  INTERVAL HISTORY:  [name] is here for routine follow up for multiple malignancies, most recently a stage I non small cell lung cancer in the right upper lobe in 2020, and a remote history (2010) of a non-small cell lung cancer in the apex of  his right lung with extension to bone. He also has a history of renal cell carcinoma stage I. Patient states that he feels okay but complains of left abdominal and flank pain where he had his nephrectomy. He visits the pain clinic to manage his chronic pain. He has discontinued his morhpine, furosemide, and carvedilol . He has begun taking percocet 10-325mg  PRN and pregabalin  150 mg BID. His CBC showed a WBC of 8.8, a hemoglobin of 15.6, and a platelet count of 258,000. His CMP was normal. His CEA is pending today. I will see him back in 6 months with CMP, CBC, CEA, and CT chest, abdomen, and pelvis. He denies fever, chills, night sweats, or other signs of infection. He denies cardiorespiratory and gastrointestinal issues.  His appetite is fine and His weight has decreased 8 pounds over last 6 months. He has had intentional weight loss of 12 pounds since October of 2024. He is accompanied today by his sister.  REVIEW OF SYSTEMS:  Review of Systems  Constitutional: Negative.  Negative for appetite change, chills, diaphoresis, fatigue, fever and unexpected weight change.  HENT:  Negative.  Negative for hearing loss, lump/mass, mouth sores, nosebleeds, sore throat, tinnitus, trouble swallowing and voice change.   Eyes: Negative.  Negative for eye problems and icterus.  Respiratory: Negative.  Negative for chest tightness, cough, hemoptysis, shortness of breath and wheezing.   Cardiovascular: Negative.  Negative for chest pain, leg swelling and palpitations.  Gastrointestinal:  Positive for abdominal pain (left side, at nephrectomy site). Negative for abdominal distention, blood in stool, constipation, diarrhea, nausea, rectal pain and vomiting.  Endocrine: Negative.  Negative for hot flashes.  Genitourinary:  Negative for bladder incontinence, difficulty urinating, dyspareunia, dysuria, frequency, hematuria, nocturia, pelvic pain and penile discharge.   Musculoskeletal:  Positive for flank pain (left,  chronic). Negative for arthralgias, back pain, gait problem, myalgias, neck pain and neck stiffness.  Skin: Negative.  Negative for itching, rash and wound.  Neurological: Negative.  Negative for dizziness, extremity weakness, gait problem, headaches, light-headedness, numbness, seizures and speech difficulty.  Hematological: Negative.  Negative for adenopathy. Does not bruise/bleed easily.  Psychiatric/Behavioral: Negative.  Negative for confusion, decreased concentration, depression, sleep disturbance and suicidal ideas. The patient is not nervous/anxious.   All other systems reviewed and are negative.    VITALS:  Blood pressure 105/65, pulse 88, temperature 98.1 F (36.7 C), temperature source Oral, resp. rate 18, height 5' 11 (1.803 m), weight 245 lb 1.6 oz (111.2 kg), SpO2 97%.  Wt Readings from Last 3 Encounters:  10/21/23 245 lb 1.6 oz (111.2 kg)  04/20/23 253 lb 6.4 oz (114.9 kg)  01/20/23 256 lb 14.4 oz (116.5 kg)    Body mass index is 34.18 kg/m.  Performance status (ECOG): 1 - Symptomatic but completely ambulatory  PHYSICAL EXAM:  Physical Exam Vitals and nursing note reviewed. Exam conducted with a chaperone present.  Constitutional:      General: He is not in acute distress.    Appearance: Normal appearance. He is normal weight. He is not ill-appearing, toxic-appearing or diaphoretic.  HENT:     Head: Normocephalic and atraumatic.     Right Ear: Tympanic membrane, ear canal and external ear normal. There is no impacted cerumen.     Left Ear: Tympanic membrane, ear canal and external ear normal. There is no impacted cerumen.     Nose: Nose normal. No congestion or rhinorrhea.     Mouth/Throat:     Mouth: Mucous membranes are moist.     Pharynx: Oropharynx is clear. No oropharyngeal exudate or posterior oropharyngeal erythema.  Eyes:     General: No scleral icterus.       Right eye: No discharge.        Left eye: No discharge.     Extraocular Movements: Extraocular  movements intact.     Conjunctiva/sclera: Conjunctivae normal.     Pupils: Pupils are equal, round, and reactive to light.  Neck:     Vascular: No carotid bruit.     Comments: Tracheostomy scar  Cardiovascular:     Rate and Rhythm: Normal rate and regular rhythm.     Pulses: Normal pulses.     Heart sounds: Normal heart sounds. No murmur heard.    No friction rub. No gallop.  Pulmonary:     Effort: Pulmonary effort is normal. No respiratory distress.     Breath sounds: No stridor. No wheezing, rhonchi or rales.  Chest:  Chest wall: No tenderness.  Abdominal:     General: Bowel sounds are normal. There is no distension.     Palpations: Abdomen is soft. There is no hepatomegaly, splenomegaly or mass.     Tenderness: There is no abdominal tenderness. There is no right CVA tenderness, left CVA tenderness, guarding or rebound.     Hernia: A hernia is present.     Comments: Large hernia at the left flank Scarring of the umbilicus  Musculoskeletal:        General: No swelling, tenderness, deformity or signs of injury. Normal range of motion.     Cervical back: Normal range of motion and neck supple. No rigidity or tenderness.     Right lower leg: Edema (mild) present.     Left lower leg: Edema (mild) present.  Lymphadenopathy:     Cervical: No cervical adenopathy.  Skin:    General: Skin is warm and dry.     Coloration: Skin is not jaundiced or pale.     Findings: No bruising, erythema, lesion or rash.  Neurological:     General: No focal deficit present.     Mental Status: He is alert and oriented to person, place, and time. Mental status is at baseline.     Cranial Nerves: No cranial nerve deficit.     Sensory: No sensory deficit.     Motor: No weakness.     Coordination: Coordination normal.     Gait: Gait normal.     Deep Tendon Reflexes: Reflexes normal.  Psychiatric:        Mood and Affect: Mood normal.        Behavior: Behavior normal.        Thought Content: Thought  content normal.        Judgment: Judgment normal.     LABS:      Latest Ref Rng & Units 10/21/2023    9:03 AM 01/20/2023   11:24 AM 07/31/2022    7:23 AM  CBC  WBC 4.0 - 10.5 K/uL 8.8  8.1  13.7   Hemoglobin 13.0 - 17.0 g/dL 84.3  86.2  87.5   Hematocrit 39.0 - 52.0 % 47.6  44.1  40.0   Platelets 150 - 400 K/uL 258  289  297       Latest Ref Rng & Units 10/21/2023    9:03 AM 01/20/2023   11:24 AM 07/29/2022    3:36 AM  CMP  Glucose 70 - 99 mg/dL 860  898  877   BUN 8 - 23 mg/dL 22  20  13    Creatinine 0.61 - 1.24 mg/dL 8.85  8.94  8.95   Sodium 135 - 145 mmol/L 138  138  136   Potassium 3.5 - 5.1 mmol/L 4.3  3.9  4.0   Chloride 98 - 111 mmol/L 103  103  104   CO2 22 - 32 mmol/L 23  26  24    Calcium  8.9 - 10.3 mg/dL 9.4  9.1  8.9   Total Protein 6.5 - 8.1 g/dL 7.1  7.0    Total Bilirubin 0.0 - 1.2 mg/dL 0.4  0.4    Alkaline Phos 38 - 126 U/L 54  47    AST 15 - 41 U/L 26  24    ALT 0 - 44 U/L 32  25     Lab Results  Component Value Date   CEA1 1.7 01/20/2023   CEA 1.33 10/21/2023   /  CEA  Date Value  Ref Range Status  01/20/2023 1.7 0.0 - 4.7 ng/mL Final    Comment:    (NOTE)                             Nonsmokers          <3.9                             Smokers             <5.6 Roche [employer] Electrochemiluminescence Immunoassay (ECLIA) Values obtained with different assay methods or kits cannot be used interchangeably.  Results cannot be interpreted as absolute evidence of the presence or absence of malignant disease. Performed At: [facility] [address] [place], [place] [id number] Jennette Shorter MD Ey:[phone]    CEA ([facility])  Date Value Ref Range Status  10/21/2023 1.33 0.00 - 5.00 ng/mL Final    Comment:    (NOTE) This test was performed using Beck[employer] Coulter's paramagnetic chemiluminescent immunoassay. Values obtained from different assay methods cannot be used interchangeably. Please note that up to 8% of patients who smoke may see  values 5.1-10.0 ng/ml and 1% of patients who smoke may see CEA levels >10.0 ng/ml. Performed at [facility], [address], [place], [place] [address]     STUDIES:      HISTORY:   Allergies  Allergen Reactions   Atorvastatin Other (See Comments)    Other reaction(s): Myalgias (intolerance)  08/07/2021: prob not related    Current Medications: Current Outpatient Medications  Medication Sig Dispense Refill   LYRICA  150 MG capsule 1 capsule.     oxyCODONE-acetaminophen  (PERCOCET) 10-325 MG tablet 1 tablet as needed Orally every 6 hrs; Duration: 30 days As needed     amitriptyline (ELAVIL) 50 MG tablet Take 50 mg by mouth at bedtime.     aspirin  EC 81 MG tablet Take 81 mg by mouth daily.     buPROPion  (WELLBUTRIN  XL) 150 MG 24 hr tablet Take 150 mg by mouth every morning.     Dulaglutide 4.5 MG/0.5ML SOPN Inject into the skin once a week.     FARXIGA 10 MG TABS tablet Take 10 mg by mouth daily.     FLUoxetine  (PROZAC ) 20 MG capsule Take 40 mg by mouth 2 (two) times daily.     latanoprost (XALATAN) 0.005 % ophthalmic solution 1 drop at bedtime.     OLANZapine  (ZYPREXA ) 10 MG tablet Take 10 mg by mouth at bedtime.     rosuvastatin  (CRESTOR ) 40 MG tablet Take 40 mg by mouth daily.     spironolactone (ALDACTONE) 25 MG tablet Take 25 mg by mouth daily.     No current facility-administered medications for this visit.     ASSESSMENT & PLAN:  Assessment:   1. 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     2. Stage I renal cell carcinoma January 2012 treated with a partial left nephrectomy.  He has not had evidence of recurrence.  3. 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 CEA was normal.  This appears to be more consistent with a squamous cell type.  It was confirmed by PET scan to be an isolated lesion and he  completed stereotactic radiation at [facility] in [place] in December 2020.  4. 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  5. Severe COPD.  He did have an episode of respiratory failure in November of 2016 requiring a temporary tracheostomy.  6. Deep venous thrombosis in August of 2017 with associated pulmonary emboli.  7. Depression and bipolar disease.  8. Severe insulin -dependent diabetes mellitus, well controlled at this time.    9. History of substance abuse.  10. Surgery for incarcerated umbilical hernia in 2012.  Plan: He visits the pain clinic to manage his chronic pain. He has discontinued his morhpine, furosemide, and carvedilol . He has begun taking percocet 10-325 mg PRN and pregabalin  150 mg BID. His CBC showed a WBC of 8.8, a hemoglobin of 15.6, and a platelet count of 258,000. His CMP was completely normal. His CEA is pending today. I will see him back in 6 months with CMP, CBC, CEA, and CT chest, abdomen, and pelvis. The patient understands the plans discussed today and is in agreement with them.  He knows to contact our office if he develops concerns prior to his next appointment.  I provided 15 minutes of face-to-face time during this this encounter and > 50% was spent counseling as documented under my assessment and plan.   Wanda VEAR Cornish, MD  [facility] - A DEPT OF [facility] [address] [place] [address] Dept: [phone] Dept Fax: [phone]   No orders of the defined types were placed in this encounter.  I,Jasmine M Lassiter,acting as a scribe for Wanda VEAR Cornish, MD.,have documented all relevant documentation on the behalf of Wanda VEAR Cornish, MD,as directed by  Wanda VEAR Cornish, MD while in the presence of Wanda VEAR Cornish, MD.

## 2023-10-21 ENCOUNTER — Inpatient Hospital Stay: Attending: Oncology

## 2023-10-21 ENCOUNTER — Telehealth: Payer: Self-pay | Admitting: Oncology

## 2023-10-21 ENCOUNTER — Inpatient Hospital Stay: Admitting: Oncology

## 2023-10-21 ENCOUNTER — Encounter: Payer: Self-pay | Admitting: Oncology

## 2023-10-21 ENCOUNTER — Other Ambulatory Visit: Payer: Self-pay | Admitting: Oncology

## 2023-10-21 VITALS — BP 105/65 | HR 88 | Temp 98.1°F | Resp 18 | Ht 71.0 in | Wt 245.1 lb

## 2023-10-21 DIAGNOSIS — C3411 Malignant neoplasm of upper lobe, right bronchus or lung: Secondary | ICD-10-CM | POA: Diagnosis present

## 2023-10-21 DIAGNOSIS — C642 Malignant neoplasm of left kidney, except renal pelvis: Secondary | ICD-10-CM | POA: Diagnosis not present

## 2023-10-21 DIAGNOSIS — C7951 Secondary malignant neoplasm of bone: Secondary | ICD-10-CM | POA: Diagnosis not present

## 2023-10-21 DIAGNOSIS — G8929 Other chronic pain: Secondary | ICD-10-CM | POA: Diagnosis not present

## 2023-10-21 DIAGNOSIS — Z85528 Personal history of other malignant neoplasm of kidney: Secondary | ICD-10-CM | POA: Insufficient documentation

## 2023-10-21 LAB — CEA (ACCESS): CEA (CHCC): 1.33 ng/mL (ref 0.00–5.00)

## 2023-10-21 LAB — CBC WITH DIFFERENTIAL (CANCER CENTER ONLY)
Abs Immature Granulocytes: 0.01 K/uL (ref 0.00–0.07)
Basophils Absolute: 0 K/uL (ref 0.0–0.1)
Basophils Relative: 1 %
Eosinophils Absolute: 0.1 K/uL (ref 0.0–0.5)
Eosinophils Relative: 2 %
HCT: 47.6 % (ref 39.0–52.0)
Hemoglobin: 15.6 g/dL (ref 13.0–17.0)
Immature Granulocytes: 0 %
Lymphocytes Relative: 27 %
Lymphs Abs: 2.4 K/uL (ref 0.7–4.0)
MCH: 29.8 pg (ref 26.0–34.0)
MCHC: 32.8 g/dL (ref 30.0–36.0)
MCV: 90.8 fL (ref 80.0–100.0)
Monocytes Absolute: 0.6 K/uL (ref 0.1–1.0)
Monocytes Relative: 7 %
Neutro Abs: 5.6 K/uL (ref 1.7–7.7)
Neutrophils Relative %: 63 %
Platelet Count: 258 K/uL (ref 150–400)
RBC: 5.24 MIL/uL (ref 4.22–5.81)
RDW: 13.8 % (ref 11.5–15.5)
WBC Count: 8.8 K/uL (ref 4.0–10.5)
nRBC: 0 % (ref 0.0–0.2)

## 2023-10-21 LAB — CMP (CANCER CENTER ONLY)
ALT: 32 U/L (ref 0–44)
AST: 26 U/L (ref 15–41)
Albumin: 4.3 g/dL (ref 3.5–5.0)
Alkaline Phosphatase: 54 U/L (ref 38–126)
Anion gap: 12 (ref 5–15)
BUN: 22 mg/dL (ref 8–23)
CO2: 23 mmol/L (ref 22–32)
Calcium: 9.4 mg/dL (ref 8.9–10.3)
Chloride: 103 mmol/L (ref 98–111)
Creatinine: 1.14 mg/dL (ref 0.61–1.24)
GFR, Estimated: >60 mL/min (ref >60)
Glucose, Bld: 139 mg/dL — ABNORMAL HIGH (ref 70–99)
Potassium: 4.3 mmol/L (ref 3.5–5.1)
Sodium: 138 mmol/L (ref 135–145)
Total Bilirubin: 0.4 mg/dL (ref 0.0–1.2)
Total Protein: 7.1 g/dL (ref 6.5–8.1)

## 2023-10-21 NOTE — Telephone Encounter (Signed)
 Patient has been scheduled for follow-up visit per 10/21/23 LOS.  Pt given an appt calendar with date and time.

## 2023-11-03 ENCOUNTER — Encounter: Payer: Self-pay | Admitting: Hematology and Oncology

## 2024-03-24 ENCOUNTER — Encounter: Payer: Self-pay | Admitting: Hematology and Oncology

## 2024-04-11 NOTE — Addendum Note (Signed)
 Addended byBETHA SHERON FALLING on: 04/11/2024 09:21 AM   Modules accepted: Orders

## 2024-04-13 ENCOUNTER — Inpatient Hospital Stay (HOSPITAL_BASED_OUTPATIENT_CLINIC_OR_DEPARTMENT_OTHER): Admission: RE | Admit: 2024-04-13 | Source: Ambulatory Visit | Admitting: Radiology

## 2024-04-13 ENCOUNTER — Inpatient Hospital Stay

## 2024-04-14 ENCOUNTER — Other Ambulatory Visit

## 2024-04-19 ENCOUNTER — Ambulatory Visit (HOSPITAL_BASED_OUTPATIENT_CLINIC_OR_DEPARTMENT_OTHER)
Admission: RE | Admit: 2024-04-19 | Discharge: 2024-04-19 | Disposition: A | Discharge status: Home / Self Care | Source: Ambulatory Visit | Attending: Oncology | Admitting: Oncology

## 2024-04-19 ENCOUNTER — Encounter (HOSPITAL_BASED_OUTPATIENT_CLINIC_OR_DEPARTMENT_OTHER): Payer: Self-pay

## 2024-04-19 ENCOUNTER — Ambulatory Visit (HOSPITAL_BASED_OUTPATIENT_CLINIC_OR_DEPARTMENT_OTHER): Admission: RE | Admit: 2024-04-19 | Source: Ambulatory Visit

## 2024-04-19 DIAGNOSIS — C3411 Malignant neoplasm of upper lobe, right bronchus or lung: Secondary | ICD-10-CM | POA: Insufficient documentation

## 2024-04-19 MED ORDER — IOHEXOL 300 MG/ML  SOLN
100.0000 mL | Freq: Once | INTRAMUSCULAR | Status: AC | PRN
  Administered 2024-04-19: 100 mL via INTRAVENOUS

## 2024-04-20 ENCOUNTER — Inpatient Hospital Stay

## 2024-04-20 ENCOUNTER — Other Ambulatory Visit (HOSPITAL_BASED_OUTPATIENT_CLINIC_OR_DEPARTMENT_OTHER): Admitting: Radiology

## 2024-04-21 ENCOUNTER — Ambulatory Visit: Admitting: Oncology

## 2024-04-25 ENCOUNTER — Inpatient Hospital Stay: Admitting: Oncology

## 2024-04-25 ENCOUNTER — Inpatient Hospital Stay

## 2024-04-25 ENCOUNTER — Inpatient Hospital Stay: Admitting: Hematology and Oncology

## 2024-04-26 ENCOUNTER — Other Ambulatory Visit (HOSPITAL_BASED_OUTPATIENT_CLINIC_OR_DEPARTMENT_OTHER)

## 2024-05-05 ENCOUNTER — Inpatient Hospital Stay: Admitting: Hematology and Oncology

## 2024-05-05 ENCOUNTER — Inpatient Hospital Stay

## 2024-05-29 ENCOUNTER — Ambulatory Visit: Admitting: Neurology

## 2024-06-07 ENCOUNTER — Ambulatory Visit: Admitting: Neurology
